# Patient Record
Sex: Female | Born: 1965
Health system: Southern US, Community
[De-identification: ages and names within clinical notes are randomized; demographics above are authoritative.]

## PROBLEM LIST (undated history)

## (undated) DIAGNOSIS — M797 Fibromyalgia: Secondary | ICD-10-CM

## (undated) DIAGNOSIS — F1910 Other psychoactive substance abuse, uncomplicated: Secondary | ICD-10-CM

## (undated) DIAGNOSIS — F32A Depression, unspecified: Secondary | ICD-10-CM

## (undated) DIAGNOSIS — F319 Bipolar disorder, unspecified: Secondary | ICD-10-CM

## (undated) DIAGNOSIS — K219 Gastro-esophageal reflux disease without esophagitis: Secondary | ICD-10-CM

## (undated) DIAGNOSIS — T7840XA Allergy, unspecified, initial encounter: Secondary | ICD-10-CM

## (undated) DIAGNOSIS — M1990 Unspecified osteoarthritis, unspecified site: Secondary | ICD-10-CM

## (undated) DIAGNOSIS — F419 Anxiety disorder, unspecified: Secondary | ICD-10-CM

## (undated) HISTORY — PX: MOUTH SURGERY: SHX715

## (undated) HISTORY — DX: Anxiety disorder, unspecified: F41.9

## (undated) HISTORY — PX: WISDOM TOOTH EXTRACTION: SHX21

## (undated) HISTORY — DX: Other psychoactive substance abuse, uncomplicated: F19.10

## (undated) HISTORY — DX: Unspecified osteoarthritis, unspecified site: M19.90

## (undated) HISTORY — DX: Allergy, unspecified, initial encounter: T78.40XA

## (undated) HISTORY — PX: NASAL SEPTUM SURGERY: SHX37

## (undated) HISTORY — PX: CHOLECYSTECTOMY: SHX55

## (undated) HISTORY — DX: Gastro-esophageal reflux disease without esophagitis: K21.9

## (undated) HISTORY — DX: Depression, unspecified: F32.A

## (undated) HISTORY — PX: BREAST SURGERY: SHX581

---

## 2001-10-07 ENCOUNTER — Emergency Department (HOSPITAL_COMMUNITY): Admission: EM | Admit: 2001-10-07 | Discharge: 2001-10-07 | Payer: Self-pay | Admitting: Emergency Medicine

## 2006-09-15 ENCOUNTER — Emergency Department (HOSPITAL_COMMUNITY): Admission: EM | Admit: 2006-09-15 | Discharge: 2006-09-15 | Payer: Self-pay | Admitting: Emergency Medicine

## 2007-01-10 ENCOUNTER — Emergency Department (HOSPITAL_COMMUNITY): Admission: EM | Admit: 2007-01-10 | Discharge: 2007-01-10 | Payer: Self-pay | Admitting: Emergency Medicine

## 2011-03-19 LAB — LIPASE, BLOOD: Lipase: 15

## 2011-03-19 LAB — COMPREHENSIVE METABOLIC PANEL
Alkaline Phosphatase: 185 — ABNORMAL HIGH
BUN: 6
CO2: 28
Chloride: 103
Creatinine, Ser: 0.52
GFR calc non Af Amer: >60
Total Bilirubin: 1.1

## 2011-03-19 LAB — POCT CARDIAC MARKERS
Myoglobin, poc: 26.2
Operator id: 294501

## 2011-03-19 LAB — D-DIMER, QUANTITATIVE

## 2012-04-29 ENCOUNTER — Other Ambulatory Visit: Payer: Self-pay | Admitting: Family Medicine

## 2012-04-29 DIAGNOSIS — Z1231 Encounter for screening mammogram for malignant neoplasm of breast: Secondary | ICD-10-CM

## 2012-06-10 ENCOUNTER — Encounter (HOSPITAL_COMMUNITY): Payer: Self-pay | Admitting: *Deleted

## 2012-06-10 ENCOUNTER — Emergency Department (HOSPITAL_COMMUNITY)
Admission: EM | Admit: 2012-06-10 | Discharge: 2012-06-10 | Disposition: A | Discharge status: Home / Self Care | Payer: Self-pay | Attending: Emergency Medicine | Admitting: Emergency Medicine

## 2012-06-10 DIAGNOSIS — K029 Dental caries, unspecified: Secondary | ICD-10-CM | POA: Insufficient documentation

## 2012-06-10 DIAGNOSIS — K089 Disorder of teeth and supporting structures, unspecified: Secondary | ICD-10-CM | POA: Insufficient documentation

## 2012-06-10 MED ORDER — NAPROXEN 500 MG PO TABS: 500.0000 mg | ORAL_TABLET | Freq: Two times a day (BID) | ORAL | Status: DC

## 2012-06-10 MED ORDER — TRAMADOL HCL 50 MG PO TABS: 50.0000 mg | ORAL_TABLET | Freq: Four times a day (QID) | ORAL | Status: DC | PRN

## 2012-06-10 NOTE — ED Provider Notes (Signed)
Medical screening examination/treatment/procedure(s) were performed by non-physician practitioner and as supervising physician I was immediately available for consultation/collaboration.  Gerhard Munch, MD 06/10/12 [phone]

## 2012-06-10 NOTE — ED Notes (Signed)
The pt has had  A toothache since her tooth broke off this am.  The pain is  extreme

## 2012-06-10 NOTE — ED Provider Notes (Signed)
History  This chart was scribed for non-physician practitioner working with Gerhard Munch, MD by Erskine Emery, ED Scribe. This patient was seen in room TR08C/TR08C and the patient's care was started at 21:51.   CSN: [id number]  Arrival date & time 06/10/12  2017   None     Chief Complaint  Patient presents with  . Dental Pain    (Consider location/radiation/quality/duration/timing/severity/associated sxs/prior treatment) The history is provided by the patient. No language interpreter was used.  Debbie Johnson is a 47 y.o. female who presents to the Emergency Department complaining of a toothache, like someone is sticking a needle in her gum, since her tooth broke off this morning. 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 She took Ibuprofen (800mg ) this morning for the pain. She also regularly takes lithium and Seroquel. Pt reports she stays at [facility] to aid her with her Bipolar and they regularly drug test her. She does not yet have insurance.   History reviewed. No pertinent past medical history.  History reviewed. No pertinent past surgical history.  No family history on file.  History  Substance Use Topics  . Smoking status: Never Smoker   . Smokeless tobacco: Not on file  . Alcohol Use: No    OB History    Grav Para Term Preterm Abortions TAB SAB Ect Mult Living                  Review of Systems  Constitutional: Negative for fever and chills.  HENT: Positive for dental problem.   Respiratory: Negative for shortness of breath.   Gastrointestinal: Negative for nausea and vomiting.  Neurological: Negative for weakness.  All other systems reviewed and are negative.    Allergies  Codeine and Septra  Home Medications   Current Outpatient Rx  Name  Route  Sig  Dispense  Refill  . IBUPROFEN 800 MG PO TABS   Oral   Take 1,600 mg by mouth every 8  (eight) hours as needed. For pain         . LITHIUM CARBONATE 300 MG PO CAPS   Oral   Take 300 mg by mouth 3 (three) times daily with meals.         Marland Kitchen QUETIAPINE FUMARATE 300 MG PO TABS   Oral   Take 150 mg by mouth at bedtime.           Triage Vitals: BP 133/68  Pulse 88  Temp 98.3 F (36.8 C) (Oral)  Resp 18  SpO2 98%  Physical Exam  Nursing note and vitals reviewed. Constitutional: She is oriented to person, place, and time. She appears well-developed and well-nourished. No distress.  HENT:  Head: Normocephalic and atraumatic.       Generalized tooth decay with gingival rescission.  Eyes: EOM are normal. Pupils are equal, round, and reactive to light.  Neck: Neck supple. No tracheal deviation present.  Cardiovascular: Normal rate.   Pulmonary/Chest: Effort normal. No respiratory distress.  Abdominal: Soft. She exhibits no distension.  Musculoskeletal: Normal range of motion. She exhibits no edema.  Neurological: She is alert and oriented to person, place, and time.  Skin: Skin is warm and dry.  Psychiatric: She has a normal mood and affect.    ED Course  Procedures (including critical care time) DIAGNOSTIC STUDIES: Oxygen Saturation is 98% on room air, normal by my interpretation.  COORDINATION OF CARE: 21:51--I evaluated the patient and we discussed a treatment plan including non-narcotic pain medication to which the pt agreed.   Labs Reviewed - No data to display No results found.   No diagnosis found.  Dental pain.  Referral made to dentist.  MDM    I personally performed the services described in this documentation, which was scribed in my presence. The recorded information has been reviewed and is accurate.       Jimmye Norman, NP 06/10/12 [phone]

## 2012-06-11 ENCOUNTER — Other Ambulatory Visit (HOSPITAL_COMMUNITY): Payer: Self-pay | Admitting: Family Medicine

## 2012-06-11 ENCOUNTER — Ambulatory Visit: Payer: Self-pay

## 2012-06-11 DIAGNOSIS — Z1231 Encounter for screening mammogram for malignant neoplasm of breast: Secondary | ICD-10-CM

## 2012-06-30 ENCOUNTER — Telehealth (HOSPITAL_COMMUNITY): Payer: Self-pay | Admitting: *Deleted

## 2012-06-30 NOTE — Telephone Encounter (Signed)
Telephoned patient at home # and left message to return call to [facility] 

## 2012-07-16 ENCOUNTER — Encounter (HOSPITAL_COMMUNITY): Payer: Self-pay | Admitting: *Deleted

## 2012-07-22 ENCOUNTER — Encounter (HOSPITAL_COMMUNITY): Payer: Self-pay

## 2012-07-22 ENCOUNTER — Other Ambulatory Visit: Payer: Self-pay | Admitting: Obstetrics and Gynecology

## 2012-07-22 ENCOUNTER — Ambulatory Visit (HOSPITAL_COMMUNITY)
Admission: RE | Admit: 2012-07-22 | Discharge: 2012-07-22 | Disposition: A | Discharge status: Home / Self Care | Payer: Self-pay | Source: Ambulatory Visit | Attending: Obstetrics and Gynecology | Admitting: Obstetrics and Gynecology

## 2012-07-22 VITALS — BP 122/72 | Temp 98.3°F | Ht 64.0 in | Wt 173.4 lb

## 2012-07-22 DIAGNOSIS — Z01419 Encounter for gynecological examination (general) (routine) without abnormal findings: Secondary | ICD-10-CM

## 2012-07-22 DIAGNOSIS — N644 Mastodynia: Secondary | ICD-10-CM

## 2012-07-22 HISTORY — DX: Fibromyalgia: M79.7

## 2012-07-22 HISTORY — DX: Bipolar disorder, unspecified: F31.9

## 2012-07-22 NOTE — Progress Notes (Signed)
Patient complained of lumps and pain in both breasts x 15 years. Patient stated her breasts are painful to the touch and she rates pain at a 10 out of 10 when touched.  Pap Smear:    Pap smear completed today. Per patient last Pap smear was April [facility] and abnormal. 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 Per patient she has had a previous abnormal Pap smear in 2009 that also showed HPV. Patient stated she did not follow up for either Pap smear. No Pap smear results in EPIC.  Physical exam: Breasts Breasts symmetrical. No skin abnormalities bilateral breasts. No nipple retraction bilateral breasts. No nipple discharge bilateral breasts. No lymphadenopathy. No lumps palpated bilateral breasts. Patient complained of bilateral center breast pain on exam. Referred patient to the [facility] for diagnostic mammogram and possible bilateral breast ultrasound. Appointment scheduled for Thursday, July 31, 2012 at 1440.          Pelvic/Bimanual   Ext Genitalia No lesions, no swelling and no discharge observed on external genitalia.         Vagina Vagina pink and normal texture. No lesions or discharge observed in vagina.          Cervix Cervix is present. Cervix pink and of normal texture. Cervix friable. No discharge observed.     Uterus Uterus is present and palpable. Uterus in normal position and normal size.        Adnexae Bilateral ovaries present and palpable. No tenderness on palpation.          Rectovaginal No rectal exam completed today since patient had no rectal complaints. No skin abnormalities observed on exam.

## 2012-07-22 NOTE — Patient Instructions (Signed)
Taught patient how to perform BSE. Let her know her next Pap smear will be due based on today's Pap smear results since patient stated she has a history of abnormal Pap smears. Referred patient to the [facility] for diagnostic mammogram and possible bilateral breast ultrasound. Appointment scheduled for Thursday, July 31, 2012 at 1440. Patient aware of appointment and will be there. Let patient know will follow up with her within the next couple weeks with results by letter or phone. Told patient to call the place she had her last mammogram and have them send the images to the Breast Center. Patient verbalized understanding.

## 2012-07-25 ENCOUNTER — Telehealth (HOSPITAL_COMMUNITY): Payer: Self-pay | Admitting: *Deleted

## 2012-07-25 NOTE — Telephone Encounter (Signed)
Telephoned patient at home # and discussed negative pap smear results. Next pap smear due in 3 years. Patient voiced understanding.  

## 2012-07-31 ENCOUNTER — Ambulatory Visit
Admission: RE | Admit: 2012-07-31 | Discharge: 2012-07-31 | Disposition: A | Discharge status: Home / Self Care | Payer: No Typology Code available for payment source | Source: Ambulatory Visit | Attending: Obstetrics and Gynecology | Admitting: Obstetrics and Gynecology

## 2012-07-31 ENCOUNTER — Other Ambulatory Visit: Payer: Self-pay | Admitting: Obstetrics and Gynecology

## 2012-07-31 DIAGNOSIS — N644 Mastodynia: Secondary | ICD-10-CM

## 2013-03-29 ENCOUNTER — Emergency Department (HOSPITAL_COMMUNITY)
Admission: EM | Admit: 2013-03-29 | Discharge: 2013-03-29 | Disposition: A | Discharge status: Home / Self Care | Payer: Self-pay | Attending: Emergency Medicine | Admitting: Emergency Medicine

## 2013-03-29 ENCOUNTER — Encounter (HOSPITAL_COMMUNITY): Payer: Self-pay | Admitting: Emergency Medicine

## 2013-03-29 DIAGNOSIS — F1310 Sedative, hypnotic or anxiolytic abuse, uncomplicated: Secondary | ICD-10-CM | POA: Insufficient documentation

## 2013-03-29 DIAGNOSIS — F1410 Cocaine abuse, uncomplicated: Secondary | ICD-10-CM | POA: Insufficient documentation

## 2013-03-29 DIAGNOSIS — F29 Unspecified psychosis not due to a substance or known physiological condition: Secondary | ICD-10-CM | POA: Insufficient documentation

## 2013-03-29 DIAGNOSIS — Z79899 Other long term (current) drug therapy: Secondary | ICD-10-CM | POA: Insufficient documentation

## 2013-03-29 DIAGNOSIS — G479 Sleep disorder, unspecified: Secondary | ICD-10-CM | POA: Insufficient documentation

## 2013-03-29 DIAGNOSIS — G8929 Other chronic pain: Secondary | ICD-10-CM | POA: Insufficient documentation

## 2013-03-29 DIAGNOSIS — F411 Generalized anxiety disorder: Secondary | ICD-10-CM | POA: Insufficient documentation

## 2013-03-29 DIAGNOSIS — F1910 Other psychoactive substance abuse, uncomplicated: Secondary | ICD-10-CM

## 2013-03-29 DIAGNOSIS — M545 Low back pain, unspecified: Secondary | ICD-10-CM | POA: Insufficient documentation

## 2013-03-29 DIAGNOSIS — F909 Attention-deficit hyperactivity disorder, unspecified type: Secondary | ICD-10-CM | POA: Insufficient documentation

## 2013-03-29 DIAGNOSIS — F319 Bipolar disorder, unspecified: Secondary | ICD-10-CM

## 2013-03-29 DIAGNOSIS — Z9119 Patient's noncompliance with other medical treatment and regimen: Secondary | ICD-10-CM | POA: Insufficient documentation

## 2013-03-29 DIAGNOSIS — Z91199 Patient's noncompliance with other medical treatment and regimen due to unspecified reason: Secondary | ICD-10-CM | POA: Insufficient documentation

## 2013-03-29 DIAGNOSIS — Z791 Long term (current) use of non-steroidal anti-inflammatories (NSAID): Secondary | ICD-10-CM | POA: Insufficient documentation

## 2013-03-29 DIAGNOSIS — F1210 Cannabis abuse, uncomplicated: Secondary | ICD-10-CM | POA: Insufficient documentation

## 2013-03-29 LAB — URINE MICROSCOPIC-ADD ON

## 2013-03-29 LAB — COMPREHENSIVE METABOLIC PANEL
ALT: 49 U/L — ABNORMAL HIGH (ref 0–35)
Calcium: 10 mg/dL (ref 8.4–10.5)
Creatinine, Ser: 0.6 mg/dL (ref 0.50–1.10)
GFR calc Af Amer: >90 mL/min (ref >90)
GFR calc non Af Amer: >90 mL/min (ref >90)
Glucose, Bld: 98 mg/dL (ref 70–99)
Sodium: 135 mEq/L (ref 135–145)
Total Protein: 8 g/dL (ref 6.0–8.3)

## 2013-03-29 LAB — RAPID URINE DRUG SCREEN, HOSP PERFORMED
Amphetamines: NOT DETECTED
Barbiturates: NOT DETECTED
Benzodiazepines: NOT DETECTED
Cocaine: POSITIVE — AB
Opiates: NOT DETECTED

## 2013-03-29 LAB — ETHANOL: Alcohol, Ethyl (B): <11 mg/dL (ref 0–11)

## 2013-03-29 LAB — URINALYSIS, ROUTINE W REFLEX MICROSCOPIC
Bilirubin Urine: NEGATIVE
Glucose, UA: NEGATIVE mg/dL
Ketones, ur: NEGATIVE mg/dL
Nitrite: NEGATIVE
Protein, ur: NEGATIVE mg/dL
pH: 6 (ref 5.0–8.0)

## 2013-03-29 LAB — CBC
Hemoglobin: 14.4 g/dL (ref 12.0–15.0)
MCH: 28.6 pg (ref 26.0–34.0)
MCHC: 33 g/dL (ref 30.0–36.0)
MCV: 86.5 fL (ref 78.0–100.0)
Platelets: 418 10*3/uL — ABNORMAL HIGH (ref 150–400)

## 2013-03-29 LAB — SALICYLATE LEVEL: Salicylate Lvl: <2 mg/dL — ABNORMAL LOW (ref 2.8–20.0)

## 2013-03-29 MED ORDER — ALUM & MAG HYDROXIDE-SIMETH 200-200-20 MG/5ML PO SUSP: 30.0000 mL | ORAL | Status: DC | PRN

## 2013-03-29 MED ORDER — IBUPROFEN 200 MG PO TABS: 600.0000 mg | ORAL_TABLET | Freq: Three times a day (TID) | ORAL | Status: DC | PRN

## 2013-03-29 MED ORDER — ONDANSETRON HCL 4 MG PO TABS: 4.0000 mg | ORAL_TABLET | Freq: Three times a day (TID) | ORAL | Status: DC | PRN

## 2013-03-29 MED ORDER — NICOTINE 21 MG/24HR TD PT24: 21.0000 mg | MEDICATED_PATCH | Freq: Every day | TRANSDERMAL | Status: DC

## 2013-03-29 NOTE — Consult Note (Signed)
[facility] Face-to-Face Psychiatry Consult   Reason for Consult:  Evaluation for inpatient treatment Referring Physician:  EDP  Debbie Johnson is an 47 y.o. female.  Assessment: AXIS I:  Substance Abuse and Bipolar Disorder AXIS II:  Deferred AXIS III:   Past Medical History  Diagnosis Date  . Fibromyalgia   . Bipolar 1 disorder    AXIS IV:  other psychosocial or environmental problems AXIS V:  51-60 moderate symptoms  Plan:  No evidence of imminent risk to self or others at present.   Patient does not meet criteria for psychiatric inpatient admission. Supportive therapy provided about ongoing stressors. Discussed crisis plan, support from social network, calling 911, coming to the Emergency Department, and calling Suicide Hotline. Follow up with primary psychiatrist   Subjective:   Debbie Johnson is a 47 y.o. female patient.  HPI:  Patient states that has been off of her bipolar medications since June 2014.  "I have been off of my meds and now I am hearing voices, which I have been hearing for a while now.  It more of whispers.  The only time it starts is when I'm not taking my medication.  I have a appointment with [facility] at 12:30 PM Monday (03/30/2013) for medication management."  Patient denies suicidal/homicidal ideation.  Patient states that voices are not command "No, they are not telling be to do anything, I can barely understand what they are saying."  Patient states that she does have paranoia "But, that is normal for me."  Patient states that she feels safe to go home.  Instructed patient since she had an appointment with her primary outpatient psychiatrist tomorrow it would be better to let her primary handle her medications, giving her medications today would not make that big of a difference prior to her appointment tomorrow.    Past Psychiatric History: Past Medical History  Diagnosis Date  . Fibromyalgia   . Bipolar 1 disorder     reports that she has never smoked.  She has never used smokeless tobacco. She reports that she does not drink alcohol or use illicit drugs. Family History  Problem Relation Age of Onset  . Cancer Mother     lung, breast  . Hypertension Mother   . Diabetes Father   . Hypertension Father   . Hypertension Brother   . Hypertension Brother   . Hypertension Brother            Allergies:   Allergies  Allergen Reactions  . Codeine Nausea And Vomiting  . Septra [Sulfamethoxazole-Trimethoprim] Nausea And Vomiting and Rash    ACT Assessment Complete:  Yes:    Educational Status    Risk to Self: Risk to self Suicidal Ideation: No-Not Currently/Within Last 6 Months Suicidal Intent: No Is patient at risk for suicide?: No Suicidal Plan?: No Access to Means: Yes Specify Access to Suicidal Means: access to drugs What has been your use of drugs/alcohol within the last 12 months?: alcohyol and crack Previous Attempts/Gestures: Yes How many times?: 1 Other Self Harm Risks: na Triggers for Past Attempts: Other (Comment) (depression in past) Intentional Self Injurious Behavior: None Family Suicide History: No Recent stressful life event(s): Other (Comment) (off meds) Persecutory voices/beliefs?: No Depression: Yes Depression Symptoms: Fatigue;Feeling angry/irritable;Feeling worthless/self pity;Guilt;Loss of interest in usual pleasures Substance abuse history and/or treatment for substance abuse?: Yes Suicide prevention information given to non-admitted patients: Yes  Risk to Others: Risk to Others Homicidal Ideation: No Thoughts of Harm to Others: No Current  Homicidal Intent: No Current Homicidal Plan: No Access to Homicidal Means: No Identified Victim: na History of harm to others?: No Assessment of Violence: None Noted Violent Behavior Description: cooperative Does patient have access to weapons?: No Criminal Charges Pending?: No Does patient have a court date: No  Abuse: Abuse/Neglect Assessment (Assessment to be  complete while patient is alone) Physical Abuse: Denies Verbal Abuse: Denies Sexual Abuse: Denies Exploitation of patient/patient's resources: Denies Self-Neglect: Denies  Prior Inpatient Therapy: Prior Inpatient Therapy Prior Inpatient Therapy: Yes Prior Therapy Dates: 2012 Prior Therapy [facility]Provider[facility]): [facility] Reason for Treatment: drugs  Prior Outpatient Therapy: Prior Outpatient Therapy Prior Outpatient Therapy: Yes Prior Therapy Dates: 2014 Prior Therapy Facilty/Provider(s): [facility] Reason for Treatment: med mgt  Additional Information: Additional Information 1:1 In Past 12 Months?: No CIRT Risk: No Elopement Risk: No Does patient have medical clearance?: Yes                  Objective: Blood pressure 127/80, pulse 83, temperature 98.6 F (37 C), resp. rate 18, SpO2 97.00%.There is no weight on file to calculate BMI. Results for orders placed during the hospital encounter of 03/29/13 (from the past 72 hour(s))  URINE RAPID DRUG SCREEN (HOSP PERFORMED)     Status: Abnormal   Collection Time    03/29/13 11:38 AM      Result Value Range   Opiates NONE DETECTED  NONE DETECTED   Cocaine POSITIVE (*) NONE DETECTED   Benzodiazepines NONE DETECTED  NONE DETECTED   Amphetamines NONE DETECTED  NONE DETECTED   Tetrahydrocannabinol POSITIVE (*) NONE DETECTED   Barbiturates NONE DETECTED  NONE DETECTED   Comment:            DRUG SCREEN FOR MEDICAL PURPOSES     ONLY.  IF CONFIRMATION IS NEEDED     FOR ANY PURPOSE, NOTIFY LAB     WITHIN 5 DAYS.                LOWEST DETECTABLE LIMITS     FOR URINE DRUG SCREEN     Drug Class       Cutoff (ng/mL)     Amphetamine      1000     Barbiturate      200     Benzodiazepine   200     Tricyclics       300     Opiates          300     Cocaine          300     THC              50  CBC     Status: Abnormal   Collection Time    03/29/13 12:00 PM      Result Value Range   WBC 9.5  4.0 - 10.5 K/uL   RBC 5.04  3.87 - 5.11  MIL/uL   Hemoglobin 14.4  12.0 - 15.0 g/dL   HCT 16.1  09.6 - 04.5 %   MCV 86.5  78.0 - 100.0 fL   MCH 28.6  26.0 - 34.0 pg   MCHC 33.0  30.0 - 36.0 g/dL   RDW 40.9  81.1 - 91.4 %   Platelets 418 (*) 150 - 400 K/uL    Current Facility-Administered Medications  Medication Dose Route Frequency Provider Last Rate Last Dose  . alum & mag hydroxide-simeth (MAALOX/MYLANTA) 200-200-20 MG/5ML suspension 30 mL  30 mL Oral PRN Renne Crigler, PA-C      .  ibuprofen (ADVIL,MOTRIN) tablet 600 mg  600 mg Oral Q8H PRN Renne Crigler, PA-C      . nicotine (NICODERM CQ - dosed in mg/24 hours) patch 21 mg  21 mg Transdermal Daily Renne Crigler, PA-C      . ondansetron (ZOFRAN) tablet 4 mg  4 mg Oral Q8H PRN Renne Crigler, PA-C       Current Outpatient Prescriptions  Medication Sig Dispense Refill  . lithium carbonate 300 MG capsule Take 300 mg by mouth 3 (three) times daily with meals.        Psychiatric Specialty Exam:     Blood pressure 127/80, pulse 83, temperature 98.6 F (37 C), resp. rate 18, SpO2 97.00%.There is no weight on file to calculate BMI.  General Appearance: Casual  Eye Contact::  Good  Speech:  Clear and Coherent and Normal Rate  Volume:  Normal  Mood:  Depressed  Affect:  Depressed  Thought Process:  Circumstantial and Goal Directed  Orientation:  Full (Time, Place, and Person)  Thought Content:  Wanting to get back on her medication  Suicidal Thoughts:  No  Homicidal Thoughts:  No  Memory:  Immediate;   Good Recent;   Good Remote;   Good  Judgement:  Fair  Insight:  Present  Psychomotor Activity:  Normal  Concentration:  Good  Recall:  Good  Akathisia:  No  Handed:  Right  AIMS (if indicated):     Assets:  Communication Skills Desire for Improvement  Sleep:      Face to face interview and consult with Dr. Gilmore Laroche  Treatment Plan Summary: Follow up with primary outpatient   Disposition:  Discharge home to follow up on current scheduled appointment for  03/30/2013 12:30 PM at [facility]  Give patient resource information for rehab services and facilities   Assunta Found, FNP-BC 03/29/2013 12:46 PM Agree with plan.

## 2013-03-29 NOTE — ED Notes (Signed)
Pt is vol here states that she was released from prison and has not been on her medication since June. States that she is also a recovering drug addict and has been relapsed for the past week. Pt wants helped from drugs and to get back on her medication. Pt states that she has a lot going on in life and that at times she feels SI to help with dealing with some of her problems, but is no currenlty SI. Calm, brought in by sherriff

## 2013-03-29 NOTE — ED Provider Notes (Signed)
Medical screening examination/treatment/procedure(s) were performed by non-physician practitioner and as supervising physician I was immediately available for consultation/collaboration.  EKG Interpretation   None         Layla Maw Gwendolyne Welford, DO 03/29/13 1528

## 2013-03-29 NOTE — BH Assessment (Signed)
Assessment Note  Debbie Johnson is an 47 y.o. female.   Pt brought in by daughter because pt has been on a 5 day binge of using alcohol, crack and THC.  Pt has been clean for 15 months.  Pt was released from prison 9/13.  Pt is employed.  Pt was on psych medications with [facility] until June of 2014 when pt decided to stop taking meds  These meds were Latuda and Lithium.    Pt reports she has an apt with [facility] Tuesday of this week at 1230.  No way to confirm on the weekend due to [facility] system.    Pt denies current suicidal, homicidal intent or plan.  Pt denies current legal issues.  Pt was in prison for a period due to drug charges but released 9-13.  Pt lives with daughter.  Pt reports feeling safe and wants to go home to eat and sleep.  Pt believes she can wait until her Tuesday apt with [facility] to get started on her medications.    Recommendations:  Pt does not want to harm self.  Pt wants to go home.  Pt has an apt with [facility] on 03-31-13 at 1230 per pt.  Psychiatry to round and determine dispo.  Axis I: Bipolar, Depressed and Substance Abuse Axis II: Deferred Axis III:  Past Medical History  Diagnosis Date  . Fibromyalgia   . Bipolar 1 disorder    Axis IV: other psychosocial or environmental problems, problems related to legal system/crime, problems related to social environment and problems with primary support group Axis V: 51-60 moderate symptoms  Past Medical History:  Past Medical History  Diagnosis Date  . Fibromyalgia   . Bipolar 1 disorder     Past Surgical History  Procedure Laterality Date  . Cholecystectomy    . Breast surgery    . Nasal septum surgery    . Wisdom tooth extraction      Family History:  Family History  Problem Relation Age of Onset  . Cancer Mother     lung, breast  . Hypertension Mother   . Diabetes Father   . Hypertension Father   . Hypertension Brother   . Hypertension Brother   . Hypertension Brother     Social History:   reports that she has never smoked. She has never used smokeless tobacco. She reports that she does not drink alcohol or use illicit drugs.  Additional Social History:  Alcohol / Drug Use Pain Medications: na Prescriptions: na Over the Counter: na History of alcohol / drug use?: Yes Longest period of sobriety (when/how long): 15 months Negative Consequences of Use: Financial;Legal;Personal relationships;Work / Mining engineer #2 Name of Substance 2: alcohol 2 - Age of First Use: teen 2 - Amount (size/oz): varies 2 - Frequency: 5 days 2 - Duration: 5 days 2 - Last Use / Amount: 03-28-13  CIWA: CIWA-Ar BP: 127/80 mmHg Pulse Rate: 83 COWS:    Allergies:  Allergies  Allergen Reactions  . Codeine Nausea And Vomiting  . Septra [Sulfamethoxazole-Trimethoprim] Nausea And Vomiting and Rash    Home Medications:  (Not in a hospital admission)  OB/GYN Status:  No LMP recorded. Patient is postmenopausal.  General Assessment Data Location of Assessment: [facility] ED Is this a Tele or Face-to-Face Assessment?: Face-to-Face Is this an Initial Assessment or a Re-assessment for this encounter?: Initial Assessment Living Arrangements: Children Can pt return to current living arrangement?: Yes Admission Status: Voluntary Is patient capable of signing voluntary admission?: Yes Transfer  from: Acute Hospital Referral Source: MD  Medical Screening Exam [facility] Walk-in ONLY) Medical Exam completed: Yes  [facility] Crisis Care Plan Living Arrangements: Children  Education Status Is patient currently in school?: No Current Grade: na Highest grade of school patient has completed: na Name of school: na Contact person: na  Risk to self Suicidal Ideation: No-Not Currently/Within Last 6 Months Suicidal Intent: No Is patient at risk for suicide?: No Suicidal Plan?: No Access to Means: Yes Specify Access to Suicidal Means: access to drugs What has been your use of drugs/alcohol within the last 12  months?: alcohyol and crack Previous Attempts/Gestures: Yes How many times?: 1 Other Self Harm Risks: na Triggers for Past Attempts: Other (Comment) (depression in past) Intentional Self Injurious Behavior: None Family Suicide History: No Recent stressful life event(s): Other (Comment) (off meds) Persecutory voices/beliefs?: No Depression: Yes Depression Symptoms: Fatigue;Feeling angry/irritable;Feeling worthless/self pity;Guilt;Loss of interest in usual pleasures Substance abuse history and/or treatment for substance abuse?: Yes Suicide prevention information given to non-admitted patients: Yes  Risk to Others Homicidal Ideation: No Thoughts of Harm to Others: No Current Homicidal Intent: No Current Homicidal Plan: No Access to Homicidal Means: No Identified Victim: na History of harm to others?: No Assessment of Violence: None Noted Violent Behavior Description: cooperative Does patient have access to weapons?: No Criminal Charges Pending?: No Does patient have a court date: No  Psychosis Hallucinations: Auditory (hx of; baseline) Delusions: Unspecified (ringing in ear sometimes)  Mental Status Report Appear/Hygiene: Disheveled Eye Contact: Good Motor Activity: Unremarkable Speech: Logical/coherent Level of Consciousness: Alert Mood: Depressed;Anxious Affect: Anxious;Depressed;Appropriate to circumstance Anxiety Level: Minimal Thought Processes: Coherent Judgement: Unimpaired Orientation: Person;Place;Situation Obsessive Compulsive Thoughts/Behaviors: None  Cognitive Functioning Concentration: Decreased Memory: Recent Intact;Remote Intact IQ: Average Insight: Poor Impulse Control: Poor Appetite: Poor Weight Loss: 0 Weight Gain: 0 Sleep: Decreased Total Hours of Sleep: 1 Vegetative Symptoms: None  ADLScreening [facility] Assessment Services) Patient's cognitive ability adequate to safely complete daily activities?: Yes Patient able to express need for  assistance with ADLs?: Yes Independently performs ADLs?: Yes (appropriate for developmental age)  Prior Inpatient Therapy Prior Inpatient Therapy: Yes Prior Therapy Dates: 2012 Prior Therapy Facilty/Provider([facility]): [facility] Reason for Treatment: drugs  Prior Outpatient Therapy Prior Outpatient Therapy: Yes Prior Therapy Dates: 2014 Prior Therapy Facilty/Provider(s): [facility] Reason for Treatment: med mgt  ADL Screening (condition at time of admission) Patient's cognitive ability adequate to safely complete daily activities?: Yes Is the patient deaf or have difficulty hearing?: No Does the patient have difficulty seeing, even when wearing glasses/contacts?: No Does the patient have difficulty concentrating, remembering, or making decisions?: No Patient able to express need for assistance with ADLs?: Yes Does the patient have difficulty dressing or bathing?: No Independently performs ADLs?: Yes (appropriate for developmental age) Does the patient have difficulty walking or climbing stairs?: No Weakness of Legs: None Weakness of Arms/Hands: None  Home Assistive Devices/Equipment Home Assistive Devices/Equipment: None  Therapy Consults (therapy consults require a physician order) PT Evaluation Needed: No OT Evalulation Needed: No SLP Evaluation Needed: No Abuse/Neglect Assessment (Assessment to be complete while patient is alone) Physical Abuse: Denies Verbal Abuse: Denies Sexual Abuse: Denies Exploitation of patient/patient's resources: Denies Self-Neglect: Denies Values / Beliefs Cultural Requests During Hospitalization: None Spiritual Requests During Hospitalization: None Consults Spiritual Care Consult Needed: No Social Work Consult Needed: No Merchant navy officer (For Healthcare) Advance Directive: Patient does not have advance directive Pre-existing out of facility DNR order (yellow form or pink MOST form): No Nutrition Screen- [facility] Patient's  home diet:  Regular  Additional Information 1:1 In Past 12 Months?: No CIRT Risk: No Elopement Risk: No Does patient have medical clearance?: Yes     Disposition:  Disposition Initial Assessment Completed for this Encounter: Yes Disposition of Patient: Outpatient treatment Type of outpatient treatment: Adult [facility])  On Site Evaluation by:   Reviewed with Physician:    Titus Mould, Eppie Gibson 03/29/2013 1:10 PM

## 2013-03-29 NOTE — ED Notes (Signed)
ACT team at bedside talking with pt. Psych ED unable to take pt at this time and will call back to take report.

## 2013-03-29 NOTE — ED Provider Notes (Signed)
CSN: [id number]     Arrival date & time 03/29/13  1120 History   First MD Initiated Contact with Patient 03/29/13 1125     Chief Complaint  Patient presents with  . Medical Clearance   (Consider location/radiation/quality/duration/timing/severity/associated sxs/prior Treatment) HPI Comments: Patient with history of bipolar disorder presents with complaint of uncontrolled bipolar symptoms, medication noncompliance, relapse of polysubstance abuse. Patient states that she was recently in prison and on her medications, however upon her release she stopped taking her medications and has been off for several months. Recently she has been manic and over this weekend has been using crack cocaine, marijuana, and has taken Xanax and amitriptyline. She presents today requesting help for these problems. She denies recent medical complaints. She has chronic lower back pain which is at baseline. Sometimes she uses the drugs to help with her pain. Onset of symptoms insidious. Course is constant.  The history is provided by the patient.    Past Medical History  Diagnosis Date  . Fibromyalgia   . Bipolar 1 disorder    Past Surgical History  Procedure Laterality Date  . Cholecystectomy    . Breast surgery    . Nasal septum surgery    . Wisdom tooth extraction     Family History  Problem Relation Age of Onset  . Cancer Mother     lung, breast  . Hypertension Mother   . Diabetes Father   . Hypertension Father   . Hypertension Brother   . Hypertension Brother   . Hypertension Brother    History  Substance Use Topics  . Smoking status: Never Smoker   . Smokeless tobacco: Never Used  . Alcohol Use: No   OB History   Grav Para Term Preterm Abortions TAB SAB Ect Mult Living   6    4  4   2      Review of Systems  Constitutional: Negative for fever.  HENT: Negative for rhinorrhea and sore throat.   Eyes: Negative for redness.  Respiratory: Negative for cough.   Cardiovascular: Negative for  chest pain.  Gastrointestinal: Negative for nausea, vomiting, abdominal pain and diarrhea.  Genitourinary: Negative for dysuria.  Musculoskeletal: Negative for myalgias.  Skin: Negative for rash.  Neurological: Negative for headaches.  Psychiatric/Behavioral: Positive for confusion and sleep disturbance. Negative for suicidal ideas and self-injury.    Allergies  Codeine and Septra  Home Medications   Current Outpatient Rx  Name  Route  Sig  Dispense  Refill  . ibuprofen (ADVIL,MOTRIN) 800 MG tablet   Oral   Take 1,600 mg by mouth every 8 (eight) hours as needed. For pain         . lithium carbonate 300 MG capsule   Oral   Take 300 mg by mouth 3 (three) times daily with meals.         . naproxen (NAPROSYN) 500 MG tablet   Oral   Take 1 tablet (500 mg total) by mouth 2 (two) times daily.   30 tablet   0   . QUEtiapine (SEROQUEL) 300 MG tablet   Oral   Take 150 mg by mouth at bedtime.         . traMADol (ULTRAM) 50 MG tablet   Oral   Take 1 tablet (50 mg total) by mouth every 6 (six) hours as needed for pain.   15 tablet   0    BP 127/80  Pulse 83  Temp(Src) 98.6 F (37 C)  Resp 18  SpO2 97% Physical Exam  Nursing note and vitals reviewed. Constitutional: She appears well-developed and well-nourished.  HENT:  Head: Normocephalic and atraumatic.  Eyes: Conjunctivae are normal. Right eye exhibits no discharge. Left eye exhibits no discharge.  Neck: Normal range of motion. Neck supple.  Cardiovascular: Normal rate, regular rhythm and normal heart sounds.   Pulmonary/Chest: Effort normal and breath sounds normal.  Abdominal: Soft. There is no tenderness.  Neurological: She is alert.  Skin: Skin is warm and dry.  Psychiatric: Her mood appears anxious. She is hyperactive.    ED Course  Procedures (including critical care time) Labs Review Labs Reviewed  CBC - Abnormal; Notable for the following:    Platelets 418 (*)    All other components within  normal limits  COMPREHENSIVE METABOLIC PANEL - Abnormal; Notable for the following:    Potassium 3.4 (*)    ALT 49 (*)    Alkaline Phosphatase 122 (*)    All other components within normal limits  SALICYLATE LEVEL - Abnormal; Notable for the following:    Salicylate Lvl <2.0 (*)    All other components within normal limits  URINE RAPID DRUG SCREEN (HOSP PERFORMED) - Abnormal; Notable for the following:    Cocaine POSITIVE (*)    Tetrahydrocannabinol POSITIVE (*)    All other components within normal limits  URINALYSIS, ROUTINE W REFLEX MICROSCOPIC - Abnormal; Notable for the following:    APPearance CLOUDY (*)    Leukocytes, UA TRACE (*)    All other components within normal limits  ACETAMINOPHEN LEVEL  ETHANOL  URINE MICROSCOPIC-ADD ON  LITHIUM LEVEL   Imaging Review No results found.  EKG Interpretation   None      11:36 AM Patient seen and examined. Work-up initiated. Medications ordered.   Vital signs reviewed and are as follows: Filed Vitals:   03/29/13 1144  BP: 127/80  Pulse: 83  Temp: 98.6 F (37 C)  Resp: 18   12:54 PM TTS aware. Reita Cliche has seen. Awaiting psych to see.     1:01 PM Labs reviewed. Pt medically cleared.   Patient was seen by psychiatry and cleared for discharge to home. She has been provided resources. She has followup at [facility] this week.  MDM   1. Bipolar disorder   2. Polysubstance abuse    Patient denies current SI, evaluated by psychiatry it was felt appropriate for outpatient treatment. Followup at [facility]. Patient restarted on her medications per psych.     Renne Crigler, PA-C 03/29/13 1453

## 2013-05-30 ENCOUNTER — Emergency Department (HOSPITAL_COMMUNITY)
Admission: EM | Admit: 2013-05-30 | Discharge: 2013-05-30 | Disposition: A | Discharge status: Home / Self Care | Payer: Self-pay | Attending: Emergency Medicine | Admitting: Emergency Medicine

## 2013-05-30 ENCOUNTER — Encounter (HOSPITAL_COMMUNITY): Payer: Self-pay | Admitting: Emergency Medicine

## 2013-05-30 DIAGNOSIS — M545 Low back pain, unspecified: Secondary | ICD-10-CM | POA: Insufficient documentation

## 2013-05-30 DIAGNOSIS — F17200 Nicotine dependence, unspecified, uncomplicated: Secondary | ICD-10-CM | POA: Insufficient documentation

## 2013-05-30 DIAGNOSIS — E669 Obesity, unspecified: Secondary | ICD-10-CM | POA: Insufficient documentation

## 2013-05-30 DIAGNOSIS — M5416 Radiculopathy, lumbar region: Secondary | ICD-10-CM

## 2013-05-30 DIAGNOSIS — F319 Bipolar disorder, unspecified: Secondary | ICD-10-CM | POA: Insufficient documentation

## 2013-05-30 DIAGNOSIS — IMO0002 Reserved for concepts with insufficient information to code with codable children: Secondary | ICD-10-CM | POA: Insufficient documentation

## 2013-05-30 DIAGNOSIS — IMO0001 Reserved for inherently not codable concepts without codable children: Secondary | ICD-10-CM | POA: Insufficient documentation

## 2013-05-30 DIAGNOSIS — Z79899 Other long term (current) drug therapy: Secondary | ICD-10-CM | POA: Insufficient documentation

## 2013-05-30 DIAGNOSIS — R209 Unspecified disturbances of skin sensation: Secondary | ICD-10-CM | POA: Insufficient documentation

## 2013-05-30 DIAGNOSIS — R159 Full incontinence of feces: Secondary | ICD-10-CM | POA: Insufficient documentation

## 2013-05-30 MED ORDER — PREDNISONE 10 MG PO TABS: 20.0000 mg | ORAL_TABLET | Freq: Every day | ORAL | Status: DC

## 2013-05-30 MED ORDER — CYCLOBENZAPRINE HCL 10 MG PO TABS: 10.0000 mg | ORAL_TABLET | Freq: Three times a day (TID) | ORAL | Status: DC | PRN

## 2013-05-30 MED ORDER — HYDROCODONE-ACETAMINOPHEN 5-325 MG PO TABS: 1.0000 | ORAL_TABLET | ORAL | Status: DC | PRN

## 2013-05-30 NOTE — ED Notes (Signed)
Pt presents with back pain that has been going on for 6 months but became worse yesterday.

## 2013-05-30 NOTE — ED Notes (Signed)
Pt c/o low right back pain x 7-8 months intermittent. Pt states pain usually resolves. Pain worse with sitting with radiation down R leg

## 2013-05-30 NOTE — ED Provider Notes (Signed)
CSN: [id number]     Arrival date & time 05/30/13  1905 History  This chart was scribed for non-physician practitioner, Ivonne Andrew, PA-C,working with Junius Argyle, MD, by Karle Plumber, ED Scribe.  This patient was seen in room WTR8/WTR8 and the patient's care was started at 10:14 PM.  Chief Complaint  Patient presents with  . Back Pain   The history is provided by the patient. No language interpreter was used.   HPI Comments:  Debbie Johnson is a 47 y.o. female who presents to the Emergency Department complaining of severe, worsening intermittent back pain that radiates down the back of her right leg that has been worsening for two days. She also reports upper back pain to the left shoulder blade. Pt states she has a hard time lifting her right leg. She states she has been seen by Dr. Izola Price for this in the past and treated with Motrin 800 mg with no relief. She reports he suggested an MRI. She states she feels like her knee is going to give out. She reports associated tingling of the right leg. She reports some episodes of slight bowel incontinence for the past two weeks. This is a small amount of soft stool occasionally in underwear. Denies perineal numbness.  No urinary incontinence or retention. She denies any recent injury. She denies hematuria, dysuria, or urinary incontinence. She reports having a h/o difficulty urinating and being followed by a nephrologist.   Past Medical History  Diagnosis Date  . Fibromyalgia   . Bipolar 1 disorder    Past Surgical History  Procedure Laterality Date  . Cholecystectomy    . Breast surgery    . Nasal septum surgery    . Wisdom tooth extraction     Family History  Problem Relation Age of Onset  . Cancer Mother     lung, breast  . Hypertension Mother   . Diabetes Father   . Hypertension Father   . Hypertension Brother   . Hypertension Brother   . Hypertension Brother    History  Substance Use Topics  . Smoking status: Current  Every Day Smoker    Types: Cigarettes  . Smokeless tobacco: Never Used  . Alcohol Use: No   OB History   Grav Para Term Preterm Abortions TAB SAB Ect Mult Living   6    4  4   2      Review of Systems  Constitutional: Negative for fever.  Genitourinary: Negative for dysuria, hematuria and difficulty urinating.  Musculoskeletal: Positive for back pain.  All other systems reviewed and are negative.    Allergies  Codeine and Septra  Home Medications   Current Outpatient Rx  Name  Route  Sig  Dispense  Refill  . lithium carbonate 300 MG capsule   Oral   Take 300 mg by mouth 3 (three) times daily with meals.          Triage Vitals: BP 113/70  Pulse 122  Temp(Src) 98.6 F (37 C) (Oral)  Resp 20  Wt 168 lb (76.204 kg)  SpO2 95% Physical Exam  Nursing note and vitals reviewed. Constitutional: She is oriented to person, place, and time. She appears well-developed and well-nourished.  Over weight  HENT:  Head: Normocephalic and atraumatic.  Eyes: EOM are normal.  Neck: Normal range of motion.  Cardiovascular: Normal rate.   Pulmonary/Chest: Effort normal.  Genitourinary:  Rectal tone normal.  Musculoskeletal: Normal range of motion. She exhibits tenderness. She exhibits no edema.  Tender to palpation along lumbar spine region   Neurological: She is alert and oriented to person, place, and time. She has normal reflexes.  Reflex Scores:      Patellar reflexes are 2+ on the right side and 2+ on the left side. Normal sensations.  Skin: Skin is warm and dry.  Psychiatric: She has a normal mood and affect. Her behavior is normal.    ED Course  Procedures  DIAGNOSTIC STUDIES: Oxygen Saturation is 95% on RA, adequate by my interpretation.   COORDINATION OF CARE: 10:22 PM- Pt seen and evaluated.  Will perform a rectal exam for rectal tone. Pt verbalizes understanding and agrees to plan.  10:30 PM- normal rectal tone. Normal strength in lower extremities. Normal  perineal sensations. At this time very low suspicion for a cauda equina syndrome. Will prescribe muscle relaxers and pain medication. Will also prescribe prednisone. Advised pt to apply heating pad. Did discuss with patient that she most likely lower car MRI for evaluation of her condition at some point. Also gave strict return precautions to return to the emergency department.     MDM   1. Low back pain   2. Lumbar radicular pain      I personally performed the services described in this documentation, which was scribed in my presence. The recorded information has been reviewed and is accurate.    Angus Seller, PA-C 05/31/13 2242

## 2013-06-01 NOTE — ED Provider Notes (Signed)
Medical screening examination/treatment/procedure(Debbie) were performed by non-physician practitioner and as supervising physician I was immediately available for consultation/collaboration.  EKG Interpretation   None         Debbie Johnson Debbie Arcenio Mullaly, MD 06/01/13 1245 

## 2014-04-05 ENCOUNTER — Encounter (HOSPITAL_COMMUNITY): Payer: Self-pay | Admitting: Emergency Medicine

## 2015-06-12 ENCOUNTER — Emergency Department (HOSPITAL_COMMUNITY)
Admission: EM | Admit: 2015-06-12 | Discharge: 2015-06-13 | Discharge status: Against Medical Advice | Payer: Self-pay | Attending: Emergency Medicine | Admitting: Emergency Medicine

## 2015-06-12 ENCOUNTER — Encounter (HOSPITAL_COMMUNITY): Payer: Self-pay

## 2015-06-12 DIAGNOSIS — F17210 Nicotine dependence, cigarettes, uncomplicated: Secondary | ICD-10-CM | POA: Insufficient documentation

## 2015-06-12 DIAGNOSIS — R339 Retention of urine, unspecified: Secondary | ICD-10-CM | POA: Insufficient documentation

## 2015-06-12 LAB — URINALYSIS, ROUTINE W REFLEX MICROSCOPIC
BILIRUBIN URINE: NEGATIVE
Glucose, UA: NEGATIVE mg/dL
Ketones, ur: NEGATIVE mg/dL
Nitrite: POSITIVE — AB
PH: 6 (ref 5.0–8.0)
Protein, ur: NEGATIVE mg/dL
SPECIFIC GRAVITY, URINE: 1.02 (ref 1.005–1.030)

## 2015-06-12 LAB — URINE MICROSCOPIC-ADD ON

## 2015-06-12 NOTE — ED Notes (Signed)
After bladder was scanned pt was able to void and collected U/A.

## 2015-06-12 NOTE — ED Notes (Signed)
Onset 2 days ago pt has only been able to urinate 1 tsp.  Pt having pressure in lower abd.

## 2015-06-12 NOTE — ED Notes (Signed)
Pt called for room placement 3x. No answer. 

## 2015-12-08 ENCOUNTER — Emergency Department (HOSPITAL_COMMUNITY)
Admission: EM | Admit: 2015-12-08 | Discharge: 2015-12-08 | Disposition: A | Discharge status: Home / Self Care | Payer: Self-pay | Attending: Emergency Medicine | Admitting: Emergency Medicine

## 2015-12-08 ENCOUNTER — Encounter (HOSPITAL_COMMUNITY): Payer: Self-pay

## 2015-12-08 DIAGNOSIS — Z79899 Other long term (current) drug therapy: Secondary | ICD-10-CM | POA: Insufficient documentation

## 2015-12-08 DIAGNOSIS — N390 Urinary tract infection, site not specified: Secondary | ICD-10-CM | POA: Insufficient documentation

## 2015-12-08 DIAGNOSIS — F17210 Nicotine dependence, cigarettes, uncomplicated: Secondary | ICD-10-CM | POA: Insufficient documentation

## 2015-12-08 LAB — URINALYSIS, ROUTINE W REFLEX MICROSCOPIC
BILIRUBIN URINE: NEGATIVE
Glucose, UA: NEGATIVE mg/dL
KETONES UR: NEGATIVE mg/dL
NITRITE: POSITIVE — AB
PH: 6 (ref 5.0–8.0)
PROTEIN: NEGATIVE mg/dL
Specific Gravity, Urine: 1.013 (ref 1.005–1.030)

## 2015-12-08 LAB — URINE MICROSCOPIC-ADD ON

## 2015-12-08 LAB — PREGNANCY, URINE: Preg Test, Ur: NEGATIVE

## 2015-12-08 MED ORDER — CEPHALEXIN 500 MG PO CAPS: 500.0000 mg | ORAL_CAPSULE | Freq: Four times a day (QID) | ORAL | Status: DC

## 2015-12-08 NOTE — ED Notes (Signed)
Pt not in room at this time

## 2015-12-08 NOTE — ED Notes (Signed)
Pt presents with 2 day h/o pressure when she voids, reports foul-smelling urine.  Pt denies any vaginal discharge.

## 2015-12-08 NOTE — Discharge Instructions (Signed)

## 2015-12-08 NOTE — ED Notes (Signed)
Karen, PA at the bedside.  

## 2015-12-08 NOTE — ED Provider Notes (Signed)
CSN: [id number]     Arrival date & time 12/08/15  [id number] History   First MD Initiated Contact with Patient 12/08/15 1011     Chief Complaint  Patient presents with  . Abdominal Pain     (Consider location/radiation/quality/duration/timing/severity/associated sxs/prior Treatment) Patient is a [age] y.o. female presenting with dysuria. The history is provided by the patient. No language interpreter was used.  Dysuria Pain quality:  Aching Pain severity:  Moderate Onset quality:  Gradual Timing:  Constant Progression:  Worsening Chronicity:  New Recent urinary tract infections: yes   Relieved by:  Nothing Worsened by:  Nothing tried Ineffective treatments:  None tried Urinary symptoms: foul-smelling urine and frequent urination   Associated symptoms: no abdominal pain   Risk factors: no recurrent urinary tract infections     Past Medical History  Diagnosis Date  . Fibromyalgia   . Bipolar 1 disorder [facility](HCC)    Past Surgical History  Procedure Laterality Date  . Cholecystectomy    . Breast surgery    . Nasal septum surgery    . Wisdom tooth extraction     Family History  Problem Relation Age of Onset  . Cancer Mother     lung, breast  . Hypertension Mother   . Diabetes Father   . Hypertension Father   . Hypertension Brother   . Hypertension Brother   . Hypertension Brother    Social History  Substance Use Topics  . Smoking status: Current Every Day Smoker -- 0.50 packs/day    Types: Cigarettes  . Smokeless tobacco: Never Used  . Alcohol Use: No   OB History    Gravida Para Term Preterm AB TAB SAB Ectopic Multiple Living   6    4  4   2      Review of Systems  Gastrointestinal: Negative for abdominal pain.  Genitourinary: Positive for dysuria.  All other systems reviewed and are negative.     Allergies  Codeine and Septra  Home Medications   Prior to Admission medications   Medication Sig Start Date End Date Taking? Authorizing Provider  cyclobenzaprine  (FLEXERIL) 10 MG tablet Take 1 tablet (10 mg total) by mouth 3 (three) times daily as needed for muscle spasms. 05/30/13   Ivonne AndrewPeter Dammen, PA-C  HYDROcodone-acetaminophen (NORCO/VICODIN) 5-325 MG per tablet Take 1-2 tablets by mouth every 4 (four) hours as needed for moderate pain. 05/30/13   Ivonne AndrewPeter Dammen, PA-C  lithium carbonate 300 MG capsule Take 300 mg by mouth 3 (three) times daily with meals.    Historical Provider, MD  lurasidone (LATUDA) 40 MG TABS tablet Take 40 mg by mouth every evening.    Historical Provider, MD  predniSONE (DELTASONE) 10 MG tablet Take 2 tablets (20 mg total) by mouth daily. 05/30/13   Peter Dammen, PA-C   BP 120/78 mmHg  Pulse 86  Temp(Src) 98.5 F (36.9 C) (Oral)  Resp 18  SpO2 98% Physical Exam  Constitutional: She is oriented to person, place, and time. She appears well-developed and well-nourished.  HENT:  Head: Normocephalic and atraumatic.  Right Ear: External ear normal.  Left Ear: External ear normal.  Eyes: Conjunctivae are normal. Pupils are equal, round, and reactive to light.  Neck: Normal range of motion. Neck supple.  Cardiovascular: Normal rate.   Pulmonary/Chest: Effort normal.  Abdominal: Soft.  Neurological: She is alert and oriented to person, place, and time. She has normal reflexes.  Skin: Skin is warm.  Psychiatric: She has a normal mood and affect.  Nursing note and vitals reviewed.   ED Course  Procedures (including critical care time) Labs Review Labs Reviewed  PREGNANCY, URINE  URINALYSIS, ROUTINE W REFLEX MICROSCOPIC (NOT AT [facility])    Imaging Review No results found. I have personally reviewed and evaluated these images and lab results as part of my medical decision-making.   EKG Interpretation None      MDM ua  Wbc's tntc many bacteria   Final diagnoses:  UTI (lower urinary tract infection)    Meds ordered this encounter  Medications  . acetaminophen (TYLENOL) 500 MG tablet    Sig: Take 1,000 mg by mouth  every 6 (six) hours as needed for mild pain.  . cephALEXin (KEFLEX) 500 MG capsule    Sig: Take 1 capsule (500 mg total) by mouth 4 (four) times daily.    Dispense:  40 capsule    Refill:  0    Order Specific Question:  Supervising Provider    Answer:  Eber HongMILLER, BRIAN [[id number]]  An After Visit Summary was printed and given to the patient.    Lonia SkinnerLeslie K CalpineSofia, PA-C 12/08/15 1213  Geoffery Lyonsouglas Delo, MD 12/08/15 1556

## 2017-03-18 ENCOUNTER — Ambulatory Visit: Payer: Self-pay | Admitting: Family Medicine

## 2017-03-22 ENCOUNTER — Encounter: Payer: Self-pay | Admitting: Family Medicine

## 2017-03-22 ENCOUNTER — Ambulatory Visit (INDEPENDENT_AMBULATORY_CARE_PROVIDER_SITE_OTHER): Payer: Self-pay | Admitting: Family Medicine

## 2017-03-22 VITALS — BP 129/70 | HR 92 | Temp 98.3°F | Resp 16 | Ht 64.0 in | Wt 171.0 lb

## 2017-03-22 DIAGNOSIS — Z23 Encounter for immunization: Secondary | ICD-10-CM

## 2017-03-22 DIAGNOSIS — Z720 Tobacco use: Secondary | ICD-10-CM

## 2017-03-22 DIAGNOSIS — G629 Polyneuropathy, unspecified: Secondary | ICD-10-CM

## 2017-03-22 DIAGNOSIS — B182 Chronic viral hepatitis C: Secondary | ICD-10-CM

## 2017-03-22 DIAGNOSIS — E663 Overweight: Secondary | ICD-10-CM

## 2017-03-22 DIAGNOSIS — F1910 Other psychoactive substance abuse, uncomplicated: Secondary | ICD-10-CM

## 2017-03-22 DIAGNOSIS — R3915 Urgency of urination: Secondary | ICD-10-CM

## 2017-03-22 MED ORDER — GABAPENTIN 300 MG PO CAPS: 300.0000 mg | ORAL_CAPSULE | Freq: Two times a day (BID) | ORAL | 0 refills | Status: DC

## 2017-03-22 NOTE — Progress Notes (Signed)
Chief Complaint  Patient presents with  . Establish Care    has hep c needs an appointment      Subjective:    Patient ID: Debbie Johnson, female    DOB: 1965/08/14, 51 y.o.   MRN: [id number]  HPI  Debbie Johnson, a 51 year old female presents to establish care. She says that she had a positive hepatitis C test and [facility] She says that she was released from prison several months ago. She is requesting a referral to infectious disease.  She has not had a primary provider due to insurance constraints. Patient has a history of polysubstance abuse prior to prison.  She is complaining of pain to lower extremities. She describes pain as burning, numbness and tingling. She says that pain occurs intermittently. She endorses urinary urgency. She denies headache, dizziness, fatigue, chest pain, dysuria, polyphagia, polydipsia, nausea, vomiting, or diarrhea.   Past Medical History:  Diagnosis Date  . Bipolar 1 disorder (HCC)   . Fibromyalgia    Social History   Social History  . Marital status: Legally Separated    Spouse name: N/A  . Number of children: N/A  . Years of education: N/A   Occupational History  . Not on file.   Social History Main Topics  . Smoking status: Current Every Day Smoker    Packs/day: 0.50    Types: Cigarettes  . Smokeless tobacco: Never Used  . Alcohol use No  . Drug use: No  . Sexual activity: Not on file   Other Topics Concern  . Not on file   Social History Narrative  . No narrative on file   Immunization History  Administered Date(s) Administered  . Influenza,inj,Quad PF,6+ Mos 03/22/2017  . Pneumococcal Polysaccharide-23 03/22/2017    Review of Systems  Constitutional: Negative for fatigue.  HENT: Negative.   Respiratory: Negative for chest tightness.   Cardiovascular: Negative.   Endocrine: Positive for polyuria. Negative for polydipsia and polyphagia.  Genitourinary: Negative.    Musculoskeletal: Positive for myalgias.  Skin: Negative.   Allergic/Immunologic: Negative.   Neurological: Positive for numbness.  Hematological: Negative.   Psychiatric/Behavioral: Negative.        History of bipolar depression       Objective:   Physical Exam  Constitutional: She is oriented to person, place, and time. She appears well-developed and well-nourished.  HENT:  Head: Normocephalic and atraumatic.  Right Ear: External ear normal.  Left Ear: External ear normal.  Nose: Nose normal.  Mouth/Throat: Oropharynx is clear and moist.  Eyes: Pupils are equal, round, and reactive to light. Conjunctivae and EOM are normal.  Neck: Normal range of motion. Neck supple.  Cardiovascular: Normal rate, regular rhythm, normal heart sounds and intact distal pulses.   Pulmonary/Chest: Effort normal and breath sounds normal.  Abdominal: Soft. Bowel sounds are normal. She exhibits no distension. There is no tenderness.  Musculoskeletal: Normal range of motion.  Neurological: She is alert and oriented to person, place, and time. She has normal reflexes.  Skin: Skin is warm and dry.  Psychiatric: She has a normal mood and affect. Her behavior is normal. Judgment and thought content normal.     BP 129/70 (BP Location: Right Arm, Patient Position: Sitting, Cuff Size: Normal)   Pulse 92   Temp 98.3 F (36.8 C) (Oral)   Resp 16   Ht 5\' 4"  (1.626 m)   Wt 171 lb (77.6 kg)   SpO2 97%   BMI  29.35 kg/m  Assessment & Plan:  1. Chronic hepatitis C without hepatic coma (HCC) Will review medical records as they become available.  Will send a referral to infectious disease at that time - COMPLETE METABOLIC PANEL WITH GFR  2. Polysubstance abuse (HCC) Patient has a history of polysubstance abuse. She has underwent a treatment program.   3. Urinary urgency Urinalysis unremarkable.   4. Tobacco use Smoking cessation instruction/counseling given:  counseled patient on the dangers of tobacco  use, advised patient to stop smoking, and reviewed strategies to maximize success  5. Overweight (BMI 25.0-29.9) Recommend a lowfat, low carbohydrate diet divided over 5-6 small meals, increase water intake to 6-8 glasses, and 150 minutes per week of cardiovascular exercise.   6. Neuropathy - Hemoglobin A1c - gabapentin (NEURONTIN) 300 MG capsule; Take 1 capsule (300 mg total) by mouth 2 (two) times daily.  Dispense: 60 capsule; Refill: 0  7. Influenza vaccination given - Flu Vaccine QUAD 6+ mos PF IM (Fluarix Quad PF)  8. Immunization due - Pneumococcal polysaccharide vaccine 23-valent greater than or equal to 2yo subcutaneous/IM   RTC: Will follow up by phone after reviewing medical records   Nolon Nations  MSN, FNP-C Patient [facility] [address] [place], [place] [address] [phone]

## 2017-03-22 NOTE — Patient Instructions (Signed)
[facility] program, call to schedule mammogram [phone]  Will send referral to infectious disease after reviewing medical records  Will follow up by phone with any abnormal lab results  Will start a trial of gabapentin for bilateral foot pain   Hepatitis C Hepatitis C is a viral infection of the liver. It can lead to scarring of the liver (cirrhosis), liver failure, or liver cancer. Hepatitis C may go undetected for months or years because people with the infection may not have symptoms, or they may have only mild symptoms. What are the causes? Hepatitis C is caused by the hepatitis C virus (HCV). The virus can be passed from one person to another through:  Blood.  Contaminated needles, such as those used for tattooing, body piercing, acupuncture, or injecting drugs.  Having unprotected sex with an infected person.  Childbirth.  Blood transfusions or organ transplants done in the [place] before 1992.  What increases the risk? Risk factors for hepatitis C include:  Having unprotected sex with an infected person.  Using illegal drugs.  What are the signs or symptoms? Symptoms of hepatitis C may include:  Fatigue.  Loss of appetite.  Nausea.  Vomiting.  Abdominal pain.  Dark yellow urine.  Yellowish skin and eyes (jaundice).  Itching of the skin.  Clay-colored bowel movements.  Joint pain.  Symptoms are not always present. How is this diagnosed? Hepatitis C is diagnosed with blood tests. Other types of tests may also be done to check how your liver is functioning. How is this treated? Your health care provider may perform noninvasive tests or a liver biopsy to help determine the best course of treatment. Treatment for hepatitis C may include one or more medicines. Your health care provider may check you for a recurring infection or other liver conditions every 6-12 months after treatment. Follow these instructions at home:  Rest as needed.  Take all  medicines as directed by your health care provider.  Do not take any medicine unless approved by your health care provider. This includes over-the-counter medicine and birth control pills.  Do not drink alcohol.  Do not have sex until approved by your health care provider.  Do not share toothbrushes, nail clippers, razors, or needles. How is this prevented? There is no vaccine for hepatitis C. The only way to prevent the disease is to reduce the risk of exposure to the virus. This may be done by:  Practicing safe sex and using condoms.  Avoiding illegal drugs.  Contact a health care provider if:  You have a fever.  You develop abdominal pain.  You develop dark urine.  You have clay-colored bowel movements.  You develop joint pains. Get help right away if:  You have increasing fatigue or weakness.  You lose your appetite.  You feel nauseous or vomit.  You develop jaundice or your jaundice gets worse.  You bruise or bleed easily. This information is not intended to replace advice given to you by your health care provider. Make sure you discuss any questions you have with your health care provider. Document Released: 05/18/2000 Document Revised: 10/27/2015 Document Reviewed: 09/02/2013 Elsevier Interactive Patient Education  2017 [employer] Inc.  Peripheral Neuropathy Peripheral neuropathy is a type of nerve damage. It affects nerves that carry signals between the spinal cord and other parts of the body. These are called peripheral nerves. With peripheral neuropathy, one nerve or a group of nerves may be damaged. What are the causes? Many things can damage peripheral nerves. For  some people with peripheral neuropathy, the cause is unknown. Some causes include:  Diabetes. This is the most common cause of peripheral neuropathy.  Injury to a nerve.  Pressure or stress on a nerve that lasts a long time.  Too little vitamin B. Alcoholism can lead to  this.  Infections.  Autoimmune diseases, such as multiple sclerosis and systemic lupus erythematosus.  Inherited nerve diseases.  Some medicines, such as cancer drugs.  Toxic substances, such as lead and mercury.  Too little blood flowing to the legs.  Kidney disease.  Thyroid disease.  What are the signs or symptoms? Different people have different symptoms. The symptoms you have will depend on which of your nerves is damaged. Common symptoms include:  Loss of feeling (numbness) in the feet and hands.  Tingling in the feet and hands.  Pain that burns.  Very sensitive skin.  Weakness.  Not being able to move a part of the body (paralysis).  Muscle twitching.  Clumsiness or poor coordination.  Loss of balance.  Not being able to control your bladder.  Feeling dizzy.  Sexual problems.  How is this diagnosed? Peripheral neuropathy is a symptom, not a disease. Finding the cause of peripheral neuropathy can be hard. To figure that out, your health care provider will take a medical history and do a physical exam. A neurological exam will also be done. This involves checking things affected by your brain, spinal cord, and nerves (nervous system). For example, your health care provider will check your reflexes, how you move, and what you can feel. Other types of tests may also be ordered, such as:  Blood tests.  A test of the fluid in your spinal cord.  Imaging tests, such as CT scans or an MRI.  Electromyography (EMG). This test checks the nerves that control muscles.  Nerve conduction velocity tests. These tests check how fast messages pass through your nerves.  Nerve biopsy. A small piece of nerve is removed. It is then checked under a microscope.  How is this treated?  Medicine is often used to treat peripheral neuropathy. Medicines may include: ? Pain-relieving medicines. Prescription or over-the-counter medicine may be suggested. ? Antiseizure medicine.  This may be used for pain. ? Antidepressants. These also may help ease pain from neuropathy. ? Lidocaine. This is a numbing medicine. You might wear a patch or be given a shot. ? Mexiletine. This medicine is typically used to help control irregular heart rhythms.  Surgery. Surgery may be needed to relieve pressure on a nerve or to destroy a nerve that is causing pain.  Physical therapy to help movement.  Assistive devices to help movement. Follow these instructions at home:  Only take over-the-counter or prescription medicines as directed by your health care provider. Follow the instructions carefully for any given medicines. Do not take any other medicines without first getting approval from your health care provider.  If you have diabetes, work closely with your health care provider to keep your blood sugar under control.  If you have numbness in your feet: ? Check every day for signs of injury or infection. Watch for redness, warmth, and swelling. ? Wear padded socks and comfortable shoes. These help protect your feet.  Do not do things that put pressure on your damaged nerve.  Do not smoke. Smoking keeps blood from getting to damaged nerves.  Avoid or limit alcohol. Too much alcohol can cause a lack of B vitamins. These vitamins are needed for healthy nerves.  Develop  a good support system. Coping with peripheral neuropathy can be stressful. Talk to a mental health specialist or join a support group if you are struggling.  Follow up with your health care provider as directed. Contact a health care provider if:  You have new signs or symptoms of peripheral neuropathy.  You are struggling emotionally from dealing with peripheral neuropathy.  You have a fever. Get help right away if:  You have an injury or infection that is not healing.  You feel very dizzy or begin vomiting.  You have chest pain.  You have trouble breathing. This information is not intended to replace  advice given to you by your health care provider. Make sure you discuss any questions you have with your health care provider. Document Released: 05/11/2002 Document Revised: 10/27/2015 Document Reviewed: 01/26/2013 Elsevier Interactive Patient Education  2017 [employer].

## 2017-03-23 LAB — COMPLETE METABOLIC PANEL WITH GFR
AG Ratio: 1.3 (calc) (ref 1.0–2.5)
ALT: 105 U/L — AB (ref 6–29)
AST: 75 U/L — AB (ref 10–35)
Albumin: 3.9 g/dL (ref 3.6–5.1)
Alkaline phosphatase (APISO): 122 U/L (ref 33–130)
BUN: 10 mg/dL (ref 7–25)
CO2: 26 mmol/L (ref 20–32)
Calcium: 9.5 mg/dL (ref 8.6–10.4)
Chloride: 105 mmol/L (ref 98–110)
Creat: 0.64 mg/dL (ref 0.50–1.05)
GFR, EST AFRICAN AMERICAN: 119 mL/min/{1.73_m2} (ref >=60)
GFR, EST NON AFRICAN AMERICAN: 103 mL/min/{1.73_m2} (ref >=60)
Globulin: 3.1 g/dL (calc) (ref 1.9–3.7)
Glucose, Bld: 179 mg/dL — ABNORMAL HIGH (ref 65–99)
Potassium: 4 mmol/L (ref 3.5–5.3)
Sodium: 139 mmol/L (ref 135–146)
Total Bilirubin: 0.4 mg/dL (ref 0.2–1.2)
Total Protein: 7 g/dL (ref 6.1–8.1)

## 2017-03-23 LAB — HEMOGLOBIN A1C
Hgb A1c MFr Bld: 5.6 % of total Hgb (ref <5.7)
MEAN PLASMA GLUCOSE: 114 (calc)
eAG (mmol/L): 6.3 (calc)

## 2017-04-15 ENCOUNTER — Other Ambulatory Visit: Payer: Self-pay | Admitting: Obstetrics and Gynecology

## 2017-04-15 DIAGNOSIS — Z1231 Encounter for screening mammogram for malignant neoplasm of breast: Secondary | ICD-10-CM

## 2017-04-22 ENCOUNTER — Encounter: Payer: Self-pay | Admitting: Family Medicine

## 2017-04-22 ENCOUNTER — Ambulatory Visit (INDEPENDENT_AMBULATORY_CARE_PROVIDER_SITE_OTHER): Payer: Self-pay | Admitting: Family Medicine

## 2017-04-22 VITALS — BP 131/65 | HR 91 | Temp 98.2°F | Resp 16 | Ht 64.0 in | Wt 174.0 lb

## 2017-04-22 DIAGNOSIS — B1920 Unspecified viral hepatitis C without hepatic coma: Secondary | ICD-10-CM

## 2017-04-22 DIAGNOSIS — G629 Polyneuropathy, unspecified: Secondary | ICD-10-CM

## 2017-04-22 MED ORDER — GABAPENTIN 300 MG PO CAPS: 300.0000 mg | ORAL_CAPSULE | Freq: Three times a day (TID) | ORAL | 5 refills | Status: AC

## 2017-04-22 NOTE — Patient Instructions (Signed)
Will increase gabapentin to 300 mg 3 times daily for better control of neuropathy. Peripheral Neuropathy Peripheral neuropathy is a type of nerve damage. It affects nerves that carry signals between the spinal cord and other parts of the body. These are called peripheral nerves. With peripheral neuropathy, one nerve or a group of nerves may be damaged. What are the causes? Many things can damage peripheral nerves. For some people with peripheral neuropathy, the cause is unknown. Some causes include:  Diabetes. This is the most common cause of peripheral neuropathy.  Injury to a nerve.  Pressure or stress on a nerve that lasts a long time.  Too little vitamin B. Alcoholism can lead to this.  Infections.  Autoimmune diseases, such as multiple sclerosis and systemic lupus erythematosus.  Inherited nerve diseases.  Some medicines, such as cancer drugs.  Toxic substances, such as lead and mercury.  Too little blood flowing to the legs.  Kidney disease.  Thyroid disease.  What are the signs or symptoms? Different people have different symptoms. The symptoms you have will depend on which of your nerves is damaged. Common symptoms include:  Loss of feeling (numbness) in the feet and hands.  Tingling in the feet and hands.  Pain that burns.  Very sensitive skin.  Weakness.  Not being able to move a part of the body (paralysis).  Muscle twitching.  Clumsiness or poor coordination.  Loss of balance.  Not being able to control your bladder.  Feeling dizzy.  Sexual problems.  How is this diagnosed? Peripheral neuropathy is a symptom, not a disease. Finding the cause of peripheral neuropathy can be hard. To figure that out, your health care provider will take a medical history and do a physical exam. A neurological exam will also be done. This involves checking things affected by your brain, spinal cord, and nerves (nervous system). For example, your health care provider  will check your reflexes, how you move, and what you can feel. Other types of tests may also be ordered, such as:  Blood tests.  A test of the fluid in your spinal cord.  Imaging tests, such as CT scans or an MRI.  Electromyography (EMG). This test checks the nerves that control muscles.  Nerve conduction velocity tests. These tests check how fast messages pass through your nerves.  Nerve biopsy. A small piece of nerve is removed. It is then checked under a microscope.  How is this treated?  Medicine is often used to treat peripheral neuropathy. Medicines may include: ? Pain-relieving medicines. Prescription or over-the-counter medicine may be suggested. ? Antiseizure medicine. This may be used for pain. ? Antidepressants. These also may help ease pain from neuropathy. ? Lidocaine. This is a numbing medicine. You might wear a patch or be given a shot. ? Mexiletine. This medicine is typically used to help control irregular heart rhythms.  Surgery. Surgery may be needed to relieve pressure on a nerve or to destroy a nerve that is causing pain.  Physical therapy to help movement.  Assistive devices to help movement. Follow these instructions at home:  Only take over-the-counter or prescription medicines as directed by your health care provider. Follow the instructions carefully for any given medicines. Do not take any other medicines without first getting approval from your health care provider.  If you have diabetes, work closely with your health care provider to keep your blood sugar under control.  If you have numbness in your feet: ? Check every day for signs of injury  or infection. Watch for redness, warmth, and swelling. ? Wear padded socks and comfortable shoes. These help protect your feet.  Do not do things that put pressure on your damaged nerve.  Do not smoke. Smoking keeps blood from getting to damaged nerves.  Avoid or limit alcohol. Too much alcohol can cause a  lack of B vitamins. These vitamins are needed for healthy nerves.  Develop a good support system. Coping with peripheral neuropathy can be stressful. Talk to a mental health specialist or join a support group if you are struggling.  Follow up with your health care provider as directed. Contact a health care provider if:  You have new signs or symptoms of peripheral neuropathy.  You are struggling emotionally from dealing with peripheral neuropathy.  You have a fever. Get help right away if:  You have an injury or infection that is not healing.  You feel very dizzy or begin vomiting.  You have chest pain.  You have trouble breathing. This information is not intended to replace advice given to you by your health care provider. Make sure you discuss any questions you have with your health care provider. Document Released: 05/11/2002 Document Revised: 10/27/2015 Document Reviewed: 01/26/2013 Elsevier Interactive Patient Education  2017 [employer]

## 2017-04-22 NOTE — Progress Notes (Signed)
Subjective:    Debbie Johnson is a 51 y.o. female with a history of polysubstance abuse presents for follow-up of bilateral lower extremity neuropathy.  Patient was started on gabapentin 300 mg twice daily 1 month ago.  She states that medication has been effective but typically wears off by the end of the day.  Patient describes neuropathy as "burning pain".  Onset of symptoms was greater than a year ago.  Symptoms are currently of moderate severity and typically occurs at varying times throughout the day. She denies fatigue, heart palpitations, dizziness, syncope, shortness of breath or heart palpitations.  She she also reports a history of hepatitis C.  Patient attempted to donate plasma and states that the letter confirming a positive hepatitis C.  Patient does not have copy of the letter   Past Medical History:  Diagnosis Date  . Bipolar 1 disorder (HCC)   . Fibromyalgia    Social History   Socioeconomic History  . Marital status: Legally Separated    Spouse name: Not on file  . Number of children: Not on file  . Years of education: Not on file  . Highest education level: Not on file  Social Needs  . Financial resource strain: Not on file  . Food insecurity - worry: Not on file  . Food insecurity - inability: Not on file  . Transportation needs - medical: Not on file  . Transportation needs - non-medical: Not on file  Occupational History  . Not on file  Tobacco Use  . Smoking status: Current Every Day Smoker    Packs/day: 0.50    Types: Cigarettes  . Smokeless tobacco: Never Used  Substance and Sexual Activity  . Alcohol use: No  . Drug use: No  . Sexual activity: Not on file  Other Topics Concern  . Not on file  Social History Narrative  . Not on file   Immunization History  Administered Date(s) Administered  . Influenza,inj,Quad PF,6+ Mos 03/22/2017  . Pneumococcal Polysaccharide-23 03/22/2017  Review of Systems  Constitutional: Negative.   HENT: Negative.    Eyes: Negative.   Respiratory: Negative.   Cardiovascular: Negative.   Gastrointestinal: Negative.   Genitourinary: Negative.   Musculoskeletal: Negative.   Skin: Negative.   Neurological: Positive for tingling. Negative for dizziness and headaches.  Psychiatric/Behavioral: Negative.     Objective:  BP 131/65 (BP Location: Right Arm, Patient Position: Sitting, Cuff Size: Normal)   Pulse 91   Temp 98.2 F (36.8 C) (Oral)   Resp 16   Ht 5\' 4"  (1.626 m)   Wt 174 lb (78.9 kg)   SpO2 96%   BMI 29.87 kg/m   General Appearance:    Alert, cooperative, no distress, appears stated age  Head:    Normocephalic, without obvious abnormality, atraumatic  Eyes:    PERRL, conjunctiva/corneas clear, EOM's intact, fundi    benign, both eyes  Ears:    Normal TM's and external ear canals, both ears  Nose:   Nares normal, septum midline, mucosa normal, no drainage    or sinus tenderness  Throat:   Lips, mucosa, and tongue normal; teeth and gums normal  Neck:   Supple, symmetrical, trachea midline, no adenopathy;    thyroid:  no enlargement/tenderness/nodules; no carotid   bruit or JVD  Back:     Symmetric, no curvature, ROM normal, no CVA tenderness  Lungs:     Clear to auscultation bilaterally, respirations unlabored  Chest Wall:    No tenderness or deformity  Heart:    Regular rate and rhythm, S1 and S2 normal, no murmur, rub   or gallop  Abdomen:     Soft, non-tender, bowel sounds active all four quadrants,    no masses, no organomegaly  Extremities:   Extremities normal, atraumatic, no cyanosis or edema  Pulses:   2+ and symmetric all extremities  Skin:   Skin color, texture, turgor normal, no rashes or lesions  Lymph nodes:   Cervical, supraclavicular, and axillary nodes normal  Neurologic:   CNII-XII intact, normal strength, sensation and reflexes    throughout   Assessment:  .BP 131/65 (BP Location: Right Arm, Patient Position: Sitting, Cuff Size: Normal)   Pulse 91   Temp 98.2  F (36.8 C) (Oral)   Resp 16   Ht 5\' 4"  (1.626 m)   Wt 174 lb (78.9 kg)   SpO2 96%   BMI 29.87 kg/m  Plan:  1. Neuropathy Will increase gabapentin to 300 mg 3 times daily. - gabapentin (NEURONTIN) 300 MG capsule; Take 1 capsule (300 mg total) 3 (three) times daily by mouth.  Dispense: 90 capsule; Refill: 5  2. Hepatitis C virus infection without hepatic coma, unspecified chronicity Will confirm hepatitis C positive and send a referral to infectious disease for further workup and evaluation. - Hepatitis C RNA quantitative   RTC: 6 months for neuropathy  Nolon NationsLaChina Moore Madalynne Gutmann  MSN, FNP-C Patient Care [facility] [address]  [place], [place] [id number] [phone]

## 2017-04-26 LAB — TIQ-NTM

## 2017-04-26 LAB — HEPATITIS C RNA QUANTITATIVE
HCV Quantitative Log: 6.54 Log IU/mL — ABNORMAL HIGH
HCV RNA, PCR, QN: 3480000 IU/mL — ABNORMAL HIGH

## 2017-04-28 ENCOUNTER — Other Ambulatory Visit: Payer: Self-pay | Admitting: Family Medicine

## 2017-04-28 DIAGNOSIS — B182 Chronic viral hepatitis C: Secondary | ICD-10-CM

## 2017-04-28 DIAGNOSIS — B1920 Unspecified viral hepatitis C without hepatic coma: Secondary | ICD-10-CM | POA: Insufficient documentation

## 2017-05-02 ENCOUNTER — Ambulatory Visit (HOSPITAL_COMMUNITY): Payer: Self-pay

## 2017-05-06 ENCOUNTER — Ambulatory Visit: Payer: Self-pay

## 2017-05-14 ENCOUNTER — Encounter: Payer: Self-pay | Admitting: Internal Medicine

## 2017-05-15 ENCOUNTER — Ambulatory Visit (INDEPENDENT_AMBULATORY_CARE_PROVIDER_SITE_OTHER): Payer: Self-pay | Admitting: Internal Medicine

## 2017-05-15 ENCOUNTER — Encounter: Payer: Self-pay | Admitting: Internal Medicine

## 2017-05-15 VITALS — BP 118/71 | HR 83 | Temp 98.0°F | Ht 64.0 in | Wt 177.0 lb

## 2017-05-15 DIAGNOSIS — B182 Chronic viral hepatitis C: Secondary | ICD-10-CM

## 2017-05-15 NOTE — Progress Notes (Signed)
[facility]   CC: consideration for treatment for chronic hepatitis C  HPI:  +Debbie Johnson is a 51 y.o. female who presents for initial evaluation and management of chronic hepatitis C.  Patient tested positive earlier this year during routine screening. Hepatitis C-associated risk factors present are: sexual contact with person with liver disease (details: shared crack pipes, snorting). Patient denies IV drug abuse. Patient has had other studies performed. Results: hepatitis C RNA by PCR, result: positive. Patient has not had prior treatment for Hepatitis C. Patient does not have a past history of liver disease. Patient does not have a family history of liver disease. Patient does not  have associated signs or symptoms related to liver disease.  Labs reviewed and confirm chronic hepatitis C with a positive viral load.   She reports being drug free now 17 months.  Interested in quitting smoking      Patient does not have documented immunity to Hepatitis A. Patient does not have documented immunity to Hepatitis B.    Review of Systems:  Constitutional: negative for fatigue and malaise Gastrointestinal: negative for diarrhea Integument/breast: negative for rash All other systems reviewed and are negative       Past Medical History:  Diagnosis Date  . Bipolar 1 disorder (HCC)   . Fibromyalgia     Prior to Admission medications   Medication Sig Start Date End Date Taking? Authorizing Provider  acetaminophen (TYLENOL) 500 MG tablet Take 1,000 mg by mouth every 6 (six) hours as needed for mild pain.   Yes [provider]  ARIPiprazole (ABILIFY) 10 MG tablet Take 10 mg by mouth daily.   Yes [provider]  gabapentin (NEURONTIN) 300 MG capsule Take 1 capsule (300 mg total) 3 (three) times daily by mouth. 04/22/17  Yes Massie MaroonHollis, Lachina M, FNP  traZODone (DESYREL) 50 MG tablet Take 50 mg by mouth at bedtime.   Yes [provider]      Allergies  Allergen Reactions  . Codeine Nausea And Vomiting  . Septra [Sulfamethoxazole-Trimethoprim] Nausea And Vomiting and Rash    Social History   Tobacco Use  . Smoking status: Current Every Day Smoker    Packs/day: 0.50    Types: Cigarettes  . Smokeless tobacco: Never Used  Substance Use Topics  . Alcohol use: No  . Drug use: No    Family History  Problem Relation Age of Onset  . Cancer Mother        lung, breast  . Hypertension Mother   . Diabetes Father   . Hypertension Father   . Hypertension Brother   . Hypertension Brother   . Hypertension Brother      Objective:  Constitutional: in no apparent distress and alert,  Vitals:   05/15/17 1032  BP: 118/71  Pulse: 83  Temp: 98 F (36.7 C)   Eyes: anicteric Cardiovascular: Cor RRR Respiratory: CTA B; normal respiratory effort Gastrointestinal: Bowel sounds are normal, liver is not enlarged, spleen is not enlarged Musculoskeletal: no pedal edema noted Skin: negatives: no rash; no porphyria cutanea tarda Lymphatic: no cervical lymphadenopathy   Laboratory Genotype: No results found for: HCVGENOTYPE HCV viral load: No results found for: HCVQUANT Lab Results  Component Value Date   WBC 9.5 03/29/2013   HGB 14.4 03/29/2013   HCT 43.6 03/29/2013   MCV 86.5 03/29/2013   PLT 418 (H) 03/29/2013    Lab Results  Component Value Date   CREATININE 0.64 03/22/2017  BUN 10 03/22/2017   NA 139 03/22/2017   K 4.0 03/22/2017   CL 105 03/22/2017   CO2 26 03/22/2017    Lab Results  Component Value Date   ALT 105 (H) 03/22/2017   AST 75 (H) 03/22/2017   ALKPHOS 122 (H) 03/29/2013     Labs and history reviewed and show CHILD-PUGH unknown 5-6 points: Child class A 7-9 points: Child class B 10-15 points: Child class C  Lab Results  Component Value Date   BILITOT 0.4 03/22/2017   ALBUMIN 4.0 03/29/2013     Assessment: New Patient with Chronic Hepatitis C genotype unknown, untreated.  I  discussed with the patient the lab findings that confirm chronic hepatitis C as well as the natural history and progression of disease including about 30% of people who develop cirrhosis of the liver if left untreated and once cirrhosis is established there is a 2-7% risk per year of liver cancer and liver failure.  I discussed the importance of treatment and benefits in reducing the risk, even if significant liver fibrosis exists.   Plan: 1) Patient counseled extensively on limiting acetaminophen to no more than 2 grams daily, avoidance of alcohol. 2) Transmission discussed with patient including sexual transmission, sharing razors and toothbrush.   3) Will need referral to gastroenterology if concern for cirrhosis 4) Will need referral for substance abuse counseling: No.; Further work up to include urine drug screen  No. 5) Will prescribe appropriate medication based on genotype and coverage  6) Hepatitis A and B titers 7) Pneumovax vaccine if not previously given 9) Further work up to include liver staging with elastography 10) will follow up after labs and elastography

## 2017-05-16 LAB — COMPLETE METABOLIC PANEL WITH GFR
AG Ratio: 1.2 (calc) (ref 1.0–2.5)
ALKALINE PHOSPHATASE (APISO): 121 U/L (ref 33–130)
ALT: 96 U/L — AB (ref 6–29)
AST: 73 U/L — AB (ref 10–35)
Albumin: 3.8 g/dL (ref 3.6–5.1)
BUN: 10 mg/dL (ref 7–25)
CALCIUM: 9.2 mg/dL (ref 8.6–10.4)
CO2: 26 mmol/L (ref 20–32)
CREATININE: 0.52 mg/dL (ref 0.50–1.05)
Chloride: 105 mmol/L (ref 98–110)
GFR, EST NON AFRICAN AMERICAN: 110 mL/min/{1.73_m2} (ref >=60)
GFR, Est African American: 127 mL/min/{1.73_m2} (ref >=60)
GLUCOSE: 98 mg/dL (ref 65–99)
Globulin: 3.3 g/dL (calc) (ref 1.9–3.7)
POTASSIUM: 3.9 mmol/L (ref 3.5–5.3)
Sodium: 138 mmol/L (ref 135–146)
Total Bilirubin: 0.5 mg/dL (ref 0.2–1.2)
Total Protein: 7.1 g/dL (ref 6.1–8.1)

## 2017-05-16 LAB — CBC WITH DIFFERENTIAL/PLATELET
BASOS ABS: 48 {cells}/uL (ref 0–200)
BASOS PCT: 0.7 %
EOS ABS: 122 {cells}/uL (ref 15–500)
Eosinophils Relative: 1.8 %
HCT: 42.3 % (ref 35.0–45.0)
Hemoglobin: 14.4 g/dL (ref 11.7–15.5)
Lymphs Abs: 2332 cells/uL (ref 850–3900)
MCH: 29.5 pg (ref 27.0–33.0)
MCHC: 34 g/dL (ref 32.0–36.0)
MCV: 86.7 fL (ref 80.0–100.0)
MONOS PCT: 8.9 %
MPV: 10.5 fL (ref 7.5–12.5)
NEUTROS PCT: 54.3 %
Neutro Abs: 3692 cells/uL (ref 1500–7800)
PLATELETS: 222 10*3/uL (ref 140–400)
RBC: 4.88 10*6/uL (ref 3.80–5.10)
RDW: 11.8 % (ref 11.0–15.0)
TOTAL LYMPHOCYTE: 34.3 %
WBC mixed population: 605 cells/uL (ref 200–950)
WBC: 6.8 10*3/uL (ref 3.8–10.8)

## 2017-05-16 LAB — TIQ-NTM

## 2017-05-16 LAB — HIV ANTIBODY (ROUTINE TESTING W REFLEX): HIV 1&2 Ab, 4th Generation: NONREACTIVE

## 2017-05-16 LAB — HEPATITIS B SURFACE ANTIBODY,QUALITATIVE: HEP B S AB: NONREACTIVE

## 2017-05-16 LAB — HEPATITIS A ANTIBODY, TOTAL: HEPATITIS A AB,TOTAL: REACTIVE — AB

## 2017-05-16 LAB — PROTIME-INR
INR: 1
Prothrombin Time: 10.4 s (ref 9.0–11.5)

## 2017-05-16 LAB — HEPATITIS B CORE ANTIBODY, TOTAL: Hep B Core Total Ab: NONREACTIVE

## 2017-05-16 LAB — HEPATITIS B SURFACE ANTIGEN: HEP B S AG: NONREACTIVE

## 2017-05-17 ENCOUNTER — Ambulatory Visit (HOSPITAL_COMMUNITY): Payer: Self-pay

## 2017-05-18 LAB — LIVER FIBROSIS, FIBROTEST-ACTITEST
ALPHA-2-MACROGLOBULIN: 302 mg/dL — AB (ref 106–279)
ALT: 99 U/L — AB (ref 6–29)
Apolipoprotein A1: 162 mg/dL (ref 101–198)
Bilirubin: 0.4 mg/dL (ref 0.2–1.2)
Fibrosis Score: 0.5
GGT: 117 U/L — ABNORMAL HIGH (ref 3–70)
Haptoglobin: 62 mg/dL (ref 43–212)
Necroinflammat ACT Score: 0.64
REFERENCE ID: 2248714

## 2017-05-20 LAB — HEPATITIS C GENOTYPE

## 2017-05-23 ENCOUNTER — Ambulatory Visit (HOSPITAL_COMMUNITY): Payer: Self-pay

## 2017-05-23 ENCOUNTER — Inpatient Hospital Stay: Admission: RE | Admit: 2017-05-23 | Payer: Self-pay | Source: Ambulatory Visit

## 2017-06-10 ENCOUNTER — Ambulatory Visit: Payer: Self-pay | Admitting: Internal Medicine

## 2017-06-13 ENCOUNTER — Ambulatory Visit (HOSPITAL_COMMUNITY): Payer: Self-pay

## 2017-07-18 ENCOUNTER — Ambulatory Visit: Payer: Self-pay | Admitting: Internal Medicine

## 2017-07-22 ENCOUNTER — Ambulatory Visit (INDEPENDENT_AMBULATORY_CARE_PROVIDER_SITE_OTHER): Payer: Self-pay | Admitting: Family Medicine

## 2017-07-22 ENCOUNTER — Encounter: Payer: Self-pay | Admitting: Family Medicine

## 2017-07-22 VITALS — BP 109/51 | HR 89 | Temp 97.9°F | Resp 16 | Ht 64.0 in | Wt 164.0 lb

## 2017-07-22 DIAGNOSIS — R220 Localized swelling, mass and lump, head: Secondary | ICD-10-CM

## 2017-07-22 DIAGNOSIS — K0889 Other specified disorders of teeth and supporting structures: Secondary | ICD-10-CM

## 2017-07-22 MED ORDER — IBUPROFEN 800 MG PO TABS
800.0000 mg | ORAL_TABLET | Freq: Three times a day (TID) | ORAL | 0 refills | Status: DC | PRN
Start: 2017-07-22 — End: 2021-09-22

## 2017-07-22 MED ORDER — CLINDAMYCIN HCL 150 MG PO CAPS: 150.0000 mg | ORAL_CAPSULE | Freq: Three times a day (TID) | ORAL | 0 refills | Status: AC

## 2017-07-22 MED ORDER — KETOROLAC TROMETHAMINE 60 MG/2ML IM SOLN
60.0000 mg | Freq: Once | INTRAMUSCULAR | Status: AC
  Administered 2017-07-22: 60 mg via INTRAMUSCULAR

## 2017-07-22 MED FILL — IBUPROFEN 800 MG TABLET: 800 | 10 days supply | Qty: 30 | Fill #0

## 2017-07-22 MED FILL — ?BENZTROPINE MES 0.5MG TAB: 0.5 | 30 days supply | Qty: 60 | Fill #0

## 2017-07-22 MED FILL — GABAPENTIN 300 MG CAPSULE: 300 | 30 days supply | Qty: 90 | Fill #0

## 2017-07-22 MED FILL — DIVALPROEX SOD DR 500 MG TA: 500 | 30 days supply | Qty: 60 | Fill #0

## 2017-07-22 MED FILL — traZODone HCL 50 MG TABS: 50 | 30 days supply | Qty: 30 | Fill #0

## 2017-07-22 MED FILL — CLINDAMYCIN HCL 150 MG CAPS: 150 | 7 days supply | Qty: 21 | Fill #0

## 2017-07-22 NOTE — Progress Notes (Signed)
Chief Complaint  Patient presents with  . Dental Pain    right bottom and top    Debbie Johnson, a 52 year old female with a history of hepatitis C and tobacco dependence presents complaining of dental pain for greater than 1 week. Patient has not been evaluated by a dentist in many years. Current pain intensity is 10/10 characterized as constant and throbbing. She last had Ibuprofen around 2 am without sustained relief. She says that dental pain is worsened by lying flat and eating. She endorses right ear pain and right facial swelling. She denies headache or fever.   Past Medical History:  Diagnosis Date  . Bipolar 1 disorder (HCC)   . Fibromyalgia    Social History   Socioeconomic History  . Marital status: Divorced    Spouse name: Not on file  . Number of children: Not on file  . Years of education: Not on file  . Highest education level: Not on file  Social Needs  . Financial resource strain: Not on file  . Food insecurity - worry: Not on file  . Food insecurity - inability: Not on file  . Transportation needs - medical: Not on file  . Transportation needs - non-medical: Not on file  Occupational History  . Not on file  Tobacco Use  . Smoking status: Current Every Day Smoker    Packs/day: 0.50    Types: Cigarettes  . Smokeless tobacco: Never Used  Substance and Sexual Activity  . Alcohol use: No  . Drug use: No  . Sexual activity: Not on file  Other Topics Concern  . Not on file  Social History Narrative  . Not on file   Immunization History  Administered Date(s) Administered  . Influenza,inj,Quad PF,6+ Mos 03/22/2017  . Pneumococcal Polysaccharide-23 03/22/2017   Review of Systems  HENT:       Dental pain and right facial swelling  Respiratory: Negative.   Cardiovascular: Negative.   Gastrointestinal: Negative.   Genitourinary: Negative.   Musculoskeletal: Negative.   Neurological: Negative.   Psychiatric/Behavioral: Negative.    Physical Exam  HENT:   Mouth/Throat: Abnormal dentition. Dental abscesses and dental caries present.  Dental caries, broken teeth. Gum erythema bilaterally. Right facial swelling  Cardiovascular: Normal rate, regular rhythm, normal heart sounds and intact distal pulses.  Pulmonary/Chest: Effort normal and breath sounds normal.  Abdominal: Soft. Bowel sounds are normal.  Neurological: She is alert.  Skin: Skin is warm and dry.   Plan  1. Right facial swelling - clindamycin (CLEOCIN) 150 MG capsule; Take 1 capsule (150 mg total) by mouth 3 (three) times daily for 7 days.  Dispense: 21 capsule; Refill: 0 - ketorolac (TORADOL) injection 60 mg - ibuprofen (ADVIL,MOTRIN) 800 MG tablet; Take 1 tablet (800 mg total) by mouth every 8 (eight) hours as needed.  Dispense: 30 tablet; Refill: 0 - Ambulatory referral to Dentistry  2. Pain, dental - clindamycin (CLEOCIN) 150 MG capsule; Take 1 capsule (150 mg total) by mouth 3 (three) times daily for 7 days.  Dispense: 21 capsule; Refill: 0 - ibuprofen (ADVIL,MOTRIN) 800 MG tablet; Take 1 tablet (800 mg total) by mouth every 8 (eight) hours as needed.  Dispense: 30 tablet; Refill: 0 - Ambulatory referral to Dentistry   RTC: As previously scheduled   The patient was given clear instructions to go to ER or return to medical center if symptoms do not improve, worsen or new problems develop. The patient verbalized understanding.    Nolon NationsLachina Moore Roquel Burgin  MSN, FNP-C [facility] [address] [place], [place] [address] [phone]

## 2017-07-22 NOTE — Patient Instructions (Signed)
Sent dental referral.  Will start Clindamycin 150 mg three times per day for dental pain and possible dental abscess For dental pain, given Toradol 60 mg without complication Ibuprofen 800 mg every 8 hours with food.  Also, tylenol 500 mg every 6 hours as needed Dental Abscess A dental abscess is pus in or around a tooth. Follow these instructions at home:  Take medicines only as told by your dentist.  If you were prescribed antibiotic medicine, finish all of it even if you start to feel better.  Rinse your mouth (gargle) often with salt water.  Do not drive or use heavy machinery, like a lawn mower, while taking pain medicine.  Do not apply heat to the outside of your mouth.  Keep all follow-up visits as told by your dentist. This is important. Contact a doctor if:  Your pain is worse, and medicine does not help. Get help right away if:  You have a fever or chills.  Your symptoms suddenly get worse.  You have a very bad headache.  You have problems breathing or swallowing.  You have trouble opening your mouth.  You have puffiness (swelling) in your neck or around your eye. This information is not intended to replace advice given to you by your health care provider. Make sure you discuss any questions you have with your health care provider. Document Released: 10/05/2014 Document Revised: 10/27/2015 Document Reviewed: 05/18/2014 Elsevier Interactive Patient Education  [employer]2018 [employer]vier Inc[employer]

## 2017-07-30 ENCOUNTER — Encounter (HOSPITAL_COMMUNITY): Payer: Self-pay

## 2017-07-30 ENCOUNTER — Emergency Department (HOSPITAL_COMMUNITY)
Admission: EM | Admit: 2017-07-30 | Discharge: 2017-07-30 | Disposition: A | Discharge status: Home / Self Care | Payer: Medicaid Other | Attending: Emergency Medicine | Admitting: Emergency Medicine

## 2017-07-30 DIAGNOSIS — F17210 Nicotine dependence, cigarettes, uncomplicated: Secondary | ICD-10-CM | POA: Insufficient documentation

## 2017-07-30 DIAGNOSIS — Y929 Unspecified place or not applicable: Secondary | ICD-10-CM | POA: Insufficient documentation

## 2017-07-30 DIAGNOSIS — Y999 Unspecified external cause status: Secondary | ICD-10-CM | POA: Insufficient documentation

## 2017-07-30 DIAGNOSIS — T23101A Burn of first degree of right hand, unspecified site, initial encounter: Secondary | ICD-10-CM

## 2017-07-30 DIAGNOSIS — X118XXA Contact with other hot tap-water, initial encounter: Secondary | ICD-10-CM | POA: Insufficient documentation

## 2017-07-30 DIAGNOSIS — Y939 Activity, unspecified: Secondary | ICD-10-CM | POA: Insufficient documentation

## 2017-07-30 DIAGNOSIS — T23162A Burn of first degree of back of left hand, initial encounter: Secondary | ICD-10-CM | POA: Insufficient documentation

## 2017-07-30 DIAGNOSIS — T23102A Burn of first degree of left hand, unspecified site, initial encounter: Secondary | ICD-10-CM

## 2017-07-30 DIAGNOSIS — T23161A Burn of first degree of back of right hand, initial encounter: Secondary | ICD-10-CM | POA: Insufficient documentation

## 2017-07-30 DIAGNOSIS — Z79899 Other long term (current) drug therapy: Secondary | ICD-10-CM | POA: Insufficient documentation

## 2017-07-30 MED ORDER — SILVER SULFADIAZINE 1 % EX CREA: 1.0000 "application " | TOPICAL_CREAM | Freq: Every day | CUTANEOUS | 0 refills | Status: DC

## 2017-07-30 MED ORDER — HYDROCORTISONE 2.5 % RE CREA: TOPICAL_CREAM | RECTAL | 0 refills | Status: DC

## 2017-07-30 MED ORDER — SILVER SULFADIAZINE 1 % EX CREA
TOPICAL_CREAM | Freq: Once | CUTANEOUS | Status: AC
  Administered 2017-07-30: 1 via TOPICAL
  Filled 2017-07-30: qty 85
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
## 2017-07-30 NOTE — Discharge Instructions (Signed)
As we discussed, keep the areas clean and dry.  He may gently wash the areas with soap and water.  Make sure that they are padded dry before applying the Silvadene cream.  Apply the Silvadene cream 2 times a day.  He can put bandages over the Silvadene cream.  As we discussed you need to follow-up with your primary care doctor in the next 2-4 days for further evaluation.  Return to the emergency department for fever, redness or swelling that extends from her hands up into your arms, difficulty moving her hands, numbness/weakness of your hands, discoloration of her hands are draining from the sites.

## 2017-07-30 NOTE — ED Notes (Signed)
See provider assessment 
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
## 2017-09-27 ENCOUNTER — Encounter: Payer: Self-pay | Admitting: Internal Medicine

## 2017-10-08 ENCOUNTER — Ambulatory Visit (HOSPITAL_COMMUNITY): Payer: Medicaid Other

## 2017-10-09 ENCOUNTER — Ambulatory Visit (HOSPITAL_COMMUNITY)
Admission: RE | Admit: 2017-10-09 | Discharge: 2017-10-09 | Disposition: A | Discharge status: Home / Self Care | Payer: Medicaid Other | Source: Ambulatory Visit | Attending: Internal Medicine | Admitting: Internal Medicine

## 2017-10-09 DIAGNOSIS — Z9049 Acquired absence of other specified parts of digestive tract: Secondary | ICD-10-CM | POA: Insufficient documentation

## 2017-10-09 DIAGNOSIS — B182 Chronic viral hepatitis C: Secondary | ICD-10-CM | POA: Insufficient documentation

## 2017-10-21 ENCOUNTER — Ambulatory Visit: Payer: Self-pay | Admitting: Family Medicine

## 2017-11-05 ENCOUNTER — Telehealth: Payer: Self-pay | Admitting: *Deleted

## 2017-11-05 NOTE — Telephone Encounter (Signed)
Great.  Maybe just have her come in to sign for Support Path.

## 2017-11-05 NOTE — Telephone Encounter (Signed)
Patient had her ultrasound on 5/8, now has stable housing, is ready for the next step. Please advise when/how to proceed with Hep C treatment. Andree CossHowell, Orlandus Borowski M, RN

## 2017-11-06 ENCOUNTER — Other Ambulatory Visit: Payer: Self-pay | Admitting: Pharmacist

## 2017-11-06 DIAGNOSIS — B182 Chronic viral hepatitis C: Secondary | ICD-10-CM

## 2017-11-06 MED ORDER — SOFOSBUVIR-VELPATASVIR 400-100 MG PO TABS: 1.0000 | ORAL_TABLET | Freq: Every day | ORAL | 2 refills | Status: DC

## 2017-11-06 NOTE — Telephone Encounter (Signed)
Looks like she is F3/F4 but compensated. [facility] can start the process for Epclusa x 12 weeks.

## 2017-11-06 NOTE — Progress Notes (Signed)
Patient now ready for Hep C treatment.  She has genotype 1a, F3/F4 CP class A (according to labs in December of 2018), no mass or nodule on ultrasound, and a viral load of 3.4 million. Will send in Epclusa x 12 weeks and will have [facility] start process for Support [facility].

## 2017-11-07 ENCOUNTER — Ambulatory Visit: Payer: Medicaid Other | Admitting: Pharmacy Technician

## 2017-11-13 ENCOUNTER — Ambulatory Visit: Payer: Self-pay | Admitting: Pharmacy Technician

## 2017-11-13 ENCOUNTER — Encounter: Payer: Self-pay | Admitting: Pharmacy Technician

## 2017-11-14 ENCOUNTER — Ambulatory Visit: Payer: Self-pay | Admitting: Pharmacy Technician

## 2017-11-15 ENCOUNTER — Telehealth: Payer: Self-pay | Admitting: Pharmacist Clinician (PhC)/ Clinical Pharmacy Specialist

## 2017-11-15 NOTE — Telephone Encounter (Signed)
Kelvin was asking if taking some tylenol or ibuprofen should be ok for her headache. Gave specific direction for both.

## 2017-12-04 ENCOUNTER — Ambulatory Visit (INDEPENDENT_AMBULATORY_CARE_PROVIDER_SITE_OTHER): Payer: Self-pay | Admitting: Pharmacist

## 2017-12-04 DIAGNOSIS — B182 Chronic viral hepatitis C: Secondary | ICD-10-CM

## 2017-12-04 NOTE — Progress Notes (Signed)
HPI: Debbie Johnson is a 52 y.o. female who presents to the [facility] pharmacy clinic for Hep C follow-up.  She has genotype 1a, F3/F4 with CP class A, and started 12 weeks of Epclusa ~6/15.  Patient Active Problem List   Diagnosis Date Noted  . Hepatitis C without hepatic coma 04/28/2017  . Polysubstance abuse (HCC) 03/29/2013  . Bipolar disorder (HCC) 03/29/2013  . Breast pain in female 07/22/2012    Patient's Medications  New Prescriptions   No medications on file  Previous Medications   ACETAMINOPHEN (TYLENOL) 500 MG TABLET    Take 1,000 mg by mouth every 6 (six) hours as needed for mild pain.   ARIPIPRAZOLE (ABILIFY) 10 MG TABLET    Take 10 mg by mouth daily.   GABAPENTIN (NEURONTIN) 300 MG CAPSULE    Take 1 capsule (300 mg total) 3 (three) times daily by mouth.   IBUPROFEN (ADVIL,MOTRIN) 800 MG TABLET    Take 1 tablet (800 mg total) by mouth every 8 (eight) hours as needed.   SILVER SULFADIAZINE (SILVADENE) 1 % CREAM    Apply 1 application topically daily.   SOFOSBUVIR-VELPATASVIR (EPCLUSA) 400-100 MG TABS    Take 1 tablet by mouth daily.   TRAZODONE (DESYREL) 50 MG TABLET    Take 50 mg by mouth at bedtime.  Modified Medications   No medications on file  Discontinued Medications   No medications on file    Allergies: Allergies  Allergen Reactions  . Codeine Nausea And Vomiting  . Septra [Sulfamethoxazole-Trimethoprim] Nausea And Vomiting and Rash    Past Medical History: Past Medical History:  Diagnosis Date  . Bipolar 1 disorder (HCC)   . Fibromyalgia     Social History: Social History   Socioeconomic History  . Marital status: Divorced    Spouse name: Not on file  . Number of children: Not on file  . Years of education: Not on file  . Highest education level: Not on file  Occupational History  . Not on file  Social Needs  . Financial resource strain: Not on file  . Food insecurity:    Worry: Not on file    Inability: Not on file  . Transportation  needs:    Medical: Not on file    Non-medical: Not on file  Tobacco Use  . Smoking status: Current Every Day Smoker    Packs/day: 0.50    Types: Cigarettes  . Smokeless tobacco: Never Used  Substance and Sexual Activity  . Alcohol use: No  . Drug use: No  . Sexual activity: Not on file  Lifestyle  . Physical activity:    Days per week: Not on file    Minutes per session: Not on file  . Stress: Not on file  Relationships  . Social connections:    Talks on phone: Not on file    Gets together: Not on file    Attends religious service: Not on file    Active member of club or organization: Not on file    Attends meetings of clubs or organizations: Not on file    Relationship status: Not on file  Other Topics Concern  . Not on file  Social History Narrative  . Not on file    Labs: Hepatitis C Lab Results  Component Value Date   HCVGENOTYPE 1a 05/15/2017   HCVRNAPCRQN 3,480,000 (H) 04/22/2017   FIBROSTAGE F2 05/15/2017   Hepatitis B Lab Results  Component Value Date   HEPBSAB NON-REACTIVE 05/15/2017  HEPBSAG NON-REACTIVE 05/15/2017   HEPBCAB NON-REACTIVE 05/15/2017   Hepatitis A Lab Results  Component Value Date   HAV REACTIVE (A) 05/15/2017   HIV Lab Results  Component Value Date   HIV NON-REACTIVE 05/15/2017   Lab Results  Component Value Date   CREATININE 0.52 05/15/2017   CREATININE 0.64 03/22/2017   CREATININE 0.60 03/29/2013   CREATININE 0.52 01/10/2007   Lab Results  Component Value Date   AST 73 (H) 05/15/2017   AST 75 (H) 03/22/2017   AST 36 03/29/2013   ALT 99 (H) 05/15/2017   ALT 96 (H) 05/15/2017   ALT 105 (H) 03/22/2017   INR 1.0 05/15/2017    Fibrosis Score: F3/F4 as assessed by [facility]   Child-Pugh Score: A  Assessment: Debbie Johnson is here today to follow-up with me for her Hep C infection.  She started Epclusa x 12 weeks on 6/15.  She has missed no doses since starting and takes it around 11am each morning. She does not have any  nausea/vomiting/diarrhea, but has had some headaches that she states are manageable.  Some fatigue but tolerable. Counseled again on the importance of compliance. I will check a Hep C VL and CMET today and have her come back and see Dr. Luciana Axeomer at end of therapy since she has cirrhosis.   Plan: - Continue Epclusa x 12 weeks - Hep C VL and CMET today - F/u with Dr. Luciana Axeomer 9/17 at 930am  Latiana Tomei L. Renso Swett, PharmD, AAHIVP, CPP Infectious Diseases Clinical Pharmacist [facility] 12/04/2017, 2:39 PM

## 2017-12-05 LAB — COMPREHENSIVE METABOLIC PANEL
AG RATIO: 1.4 (calc) (ref 1.0–2.5)
ALBUMIN MSPROF: 4 g/dL (ref 3.6–5.1)
ALKALINE PHOSPHATASE (APISO): 88 U/L (ref 33–130)
ALT: 10 U/L (ref 6–29)
AST: 14 U/L (ref 10–35)
BILIRUBIN TOTAL: 0.3 mg/dL (ref 0.2–1.2)
BUN: 10 mg/dL (ref 7–25)
CHLORIDE: 105 mmol/L (ref 98–110)
CO2: 28 mmol/L (ref 20–32)
Calcium: 9.3 mg/dL (ref 8.6–10.4)
Creat: 0.57 mg/dL (ref 0.50–1.05)
Globulin: 2.9 g/dL (calc) (ref 1.9–3.7)
Glucose, Bld: 117 mg/dL — ABNORMAL HIGH (ref 65–99)
POTASSIUM: 4.3 mmol/L (ref 3.5–5.3)
SODIUM: 139 mmol/L (ref 135–146)
TOTAL PROTEIN: 6.9 g/dL (ref 6.1–8.1)

## 2017-12-07 ENCOUNTER — Other Ambulatory Visit: Payer: Self-pay

## 2017-12-07 ENCOUNTER — Emergency Department (HOSPITAL_BASED_OUTPATIENT_CLINIC_OR_DEPARTMENT_OTHER)
Admission: EM | Admit: 2017-12-07 | Discharge: 2017-12-07 | Disposition: A | Discharge status: Home / Self Care | Payer: Medicaid Other | Attending: Emergency Medicine | Admitting: Emergency Medicine

## 2017-12-07 ENCOUNTER — Encounter (HOSPITAL_BASED_OUTPATIENT_CLINIC_OR_DEPARTMENT_OTHER): Payer: Self-pay | Admitting: Emergency Medicine

## 2017-12-07 DIAGNOSIS — F17210 Nicotine dependence, cigarettes, uncomplicated: Secondary | ICD-10-CM | POA: Insufficient documentation

## 2017-12-07 DIAGNOSIS — Z79899 Other long term (current) drug therapy: Secondary | ICD-10-CM | POA: Insufficient documentation

## 2017-12-07 DIAGNOSIS — N3001 Acute cystitis with hematuria: Secondary | ICD-10-CM | POA: Insufficient documentation

## 2017-12-07 LAB — HEPATITIS C RNA QUANTITATIVE
HCV QUANT LOG: 2 {Log_IU}/mL — AB
HCV RNA, PCR, QN: 100 IU/mL — ABNORMAL HIGH

## 2017-12-07 LAB — WET PREP, GENITAL
Clue Cells Wet Prep HPF POC: NONE SEEN
SPERM: NONE SEEN
TRICH WET PREP: NONE SEEN
Yeast Wet Prep HPF POC: NONE SEEN

## 2017-12-07 LAB — URINALYSIS, MICROSCOPIC (REFLEX): WBC, UA: >50 WBC/hpf (ref 0–5)

## 2017-12-07 LAB — URINALYSIS, ROUTINE W REFLEX MICROSCOPIC
BILIRUBIN URINE: NEGATIVE
Glucose, UA: NEGATIVE mg/dL
Ketones, ur: 15 mg/dL — AB
Nitrite: POSITIVE — AB
PROTEIN: 100 mg/dL — AB
pH: 6 (ref 5.0–8.0)

## 2017-12-07 LAB — PREGNANCY, URINE: PREG TEST UR: NEGATIVE

## 2017-12-07 MED ORDER — CEPHALEXIN 500 MG PO CAPS: 500.0000 mg | ORAL_CAPSULE | Freq: Two times a day (BID) | ORAL | 0 refills | Status: DC

## 2017-12-07 MED ORDER — PHENAZOPYRIDINE HCL 200 MG PO TABS: 200.0000 mg | ORAL_TABLET | Freq: Three times a day (TID) | ORAL | 0 refills | Status: DC

## 2017-12-07 MED ORDER — CEPHALEXIN 250 MG PO CAPS
500.0000 mg | ORAL_CAPSULE | Freq: Once | ORAL | Status: AC
  Administered 2017-12-07: 500 mg via ORAL
  Filled 2017-12-07: qty 2

## 2017-12-07 NOTE — ED Triage Notes (Signed)
Pt c/o dysuria and frequency x 1 week.

## 2017-12-07 NOTE — Discharge Instructions (Addendum)
Please read and follow all provided instructions You have been seen today for your complaint of pain with urination. Your lab work showed urine infection.  Your discharge medications include: 1) Keflex Please take all of your antibiotics until finished!   You may develop abdominal discomfort or diarrhea from the antibiotic.  You may help offset this with probiotics which you can buy or get in yogurt. Do not eat or take the probiotics until 2 hours after your antibiotic. Do not take your medicine if develop an itchy rash, swelling in your mouth or lips, or difficulty breathing.  2) Pyridium  This medication will help relieve pain and burning but does not treat the infection.  Make sure that you wear a panty liner as it may stain your underwear. Do not be alarmed if this turns your urine orange. To void upset stomach please take with food. Home care instructions are as follows:  1) Please drink plenty of water. Avoid tea and beverages with caffeine like coffee or soda 2) If you are sexually active, make sure to urinate immediately after intercourse.  Follow up:  Please follow up with your primary care physician in 1-2 days. If you do not have one please [facility] number listed above. Please seek immediate medical care if you develop any of the following symptoms: SEEK MEDICAL CARE IF:  You have back pain.  You develop a fever.  Your symptoms do not begin to resolve within 3 days.  SEEK IMMEDIATE MEDICAL CARE IF:  You have severe back pain or lower abdominal pain.  You develop chills.  You have nausea or vomiting.  You have continued burning or discomfort with urination even after completion of antibiotic. Additional Information:  Gonorrhea and Chlamydia cultures are pending.  You were not tested for HIV or syphilis today.  If you would like further testing of this please visit your primary care doctor or the health department.  Your results will take 48 hours to  complete.  Your vital signs today were: BP 112/74 (BP Location: Right Arm)    Pulse 96    Temp 98.6 F (37 C) (Oral)    Resp 18    Ht 5\' 4"  (1.626 m)    Wt 63.5 kg (140 lb)    SpO2 97%    BMI 24.03 kg/m  If your blood pressure (BP) was elevated above 135/85 this visit, please have this repeated by your doctor within one month. ---------------

## 2017-12-07 NOTE — ED Notes (Signed)
Patient stated that she thinks her Bartholin's duct is inflamed.

## 2017-12-07 NOTE — ED Notes (Signed)
ED Provider at bedside. 

## 2017-12-07 NOTE — ED Provider Notes (Signed)
[facility] EMERGENCY DEPARTMENT Provider Note   CSN: [id number] Arrival date & time: 12/07/17  1542     History   Chief Complaint Chief Complaint  Patient presents with  . Dysuria  . Urinary Frequency    HPI Debbie Johnson is a 52 y.o. female with a history of bipolar 1 disorder and fibromyalgia who presents emergency department today for her symptoms.  Patient states for the last 2-3 days she has been having suprapubic pressure, urinary urgency, urinary frequency, as well as dysuria.  She notes is consistent with her prior UTIs.  Patient reports that she has not taken anything for her symptoms. She reports that nothing makes her symptoms better or worse. Rates her current pain level as a 2/10. She describes this as pressure.  Patient denies any fever, nausea/vomiting, hematuria, pelvic pain or flank pain.  She denies history of kidney stones.  Patient is sexually active with one female partner over the last 6 months and states that they use protection on all occasions.  She states she has no concerns for STDs at this time.    HPI  Past Medical History:  Diagnosis Date  . Bipolar 1 disorder (HCC)   . Fibromyalgia     Patient Active Problem List   Diagnosis Date Noted  . Hepatitis C without hepatic coma 04/28/2017  . Polysubstance abuse (HCC) 03/29/2013  . Bipolar disorder (HCC) 03/29/2013  . Breast pain in female 07/22/2012    Past Surgical History:  Procedure Laterality Date  . BREAST SURGERY    . CHOLECYSTECTOMY    . NASAL SEPTUM SURGERY    . WISDOM TOOTH EXTRACTION       OB History    Gravida  6   Para      Term      Preterm      AB  4   Living  2     SAB  4   TAB      Ectopic      Multiple      Live Births               Home Medications    Prior to Admission medications   Medication Sig Start Date End Date Taking? Authorizing Provider  gabapentin (NEURONTIN) 300 MG capsule Take 1 capsule (300 mg total) 3 (three) times daily  by mouth. 04/22/17  Yes Hollis, Rosalene Billings, FNP  Sofosbuvir-Velpatasvir (EPCLUSA) 400-100 MG TABS Take 1 tablet by mouth daily. 11/06/17  Yes Kuppelweiser, Cassie L, RPH-CPP  traZODone (DESYREL) 50 MG tablet Take 50 mg by mouth at bedtime.   Yes [provider]  ibuprofen (ADVIL,MOTRIN) 800 MG tablet Take 1 tablet (800 mg total) by mouth every 8 (eight) hours as needed. 07/22/17   Massie Maroon, FNP    Family History Family History  Problem Relation Age of Onset  . Cancer Mother        lung, breast  . Hypertension Mother   . Diabetes Father   . Hypertension Father   . Hypertension Brother   . Hypertension Brother   . Hypertension Brother     Social History Social History   Tobacco Use  . Smoking status: Current Every Day Smoker    Packs/day: 0.50    Types: Cigarettes  . Smokeless tobacco: Never Used  Substance Use Topics  . Alcohol use: No  . Drug use: No    Comment: recovering addict     Allergies   Codeine and Septra [  sulfamethoxazole-trimethoprim]   Review of Systems Review of Systems  All other systems reviewed and are negative.    Physical Exam Updated Vital Signs BP 112/74 (BP Location: Right Arm)   Pulse 96   Temp 98.6 F (37 C) (Oral)   Resp 18   Ht 5\' 4"  (1.626 m)   Wt 63.5 kg (140 lb)   SpO2 97%   BMI 24.03 kg/m   Physical Exam  Constitutional: She appears well-developed and well-nourished.  HENT:  Head: Normocephalic and atraumatic.  Right Ear: External ear normal.  Left Ear: External ear normal.  Nose: Nose normal.  Mouth/Throat: Uvula is midline, oropharynx is clear and moist and mucous membranes are normal. No tonsillar exudate.  Eyes: Pupils are equal, round, and reactive to light. Right eye exhibits no discharge. Left eye exhibits no discharge. No scleral icterus.  Neck: Trachea normal. Neck supple. No spinous process tenderness present. No neck rigidity. Normal range of motion present.  Cardiovascular: Normal rate, regular  rhythm and intact distal pulses.  No murmur heard. Pulses:      Radial pulses are 2+ on the right side, and 2+ on the left side.       Dorsalis pedis pulses are 2+ on the right side, and 2+ on the left side.       Posterior tibial pulses are 2+ on the right side, and 2+ on the left side.  No lower extremity swelling or edema. Calves symmetric in size bilaterally.  Pulmonary/Chest: Effort normal and breath sounds normal. She exhibits no tenderness.  Abdominal: Soft. Bowel sounds are normal. She exhibits no distension. There is no tenderness. There is no rigidity, no rebound, no guarding and no CVA tenderness.  Genitourinary:  Genitourinary Comments: Exam performed by Jacinto HalimMichael M Migel Hannis, exam chaperoned Pelvic exam: normal external genitalia without evidence of trauma. VULVA: normal appearing vulva with no masses, tenderness or lesion. VAGINA: normal appearing vagina with normal color and discharge, no lesions. CERVIX: normal appearing cervix without lesions, cervical motion tenderness absent, cervical os closed with out purulent discharge; vaginal discharge - clear, Wet prep and DNA probe for chlamydia and GC obtained.   ADNEXA: normal adnexa in size, nontender and no masses UTERUS: uterus is normal size, shape, consistency and nontender.   Musculoskeletal: She exhibits no edema.  Lymphadenopathy:    She has no cervical adenopathy.  Neurological: She is alert.  Skin: Skin is warm and dry. No rash noted. She is not diaphoretic.  Psychiatric: She has a normal mood and affect.  Nursing note and vitals reviewed.    ED Treatments / Results  Labs (all labs ordered are listed, but only abnormal results are displayed) Labs Reviewed  WET PREP, GENITAL - Abnormal; Notable for the following components:      Result Value   WBC, Wet Prep HPF POC MODERATE (*)    All other components within normal limits  URINALYSIS, ROUTINE W REFLEX MICROSCOPIC - Abnormal; Notable for the following components:    APPearance CLOUDY (*)    Specific Gravity, Urine >1.030 (*)    Hgb urine dipstick MODERATE (*)    Ketones, ur 15 (*)    Protein, ur 100 (*)    Nitrite POSITIVE (*)    Leukocytes, UA MODERATE (*)    All other components within normal limits  URINALYSIS, MICROSCOPIC (REFLEX) - Abnormal; Notable for the following components:   Bacteria, UA MANY (*)    Non Squamous Epithelial PRESENT (*)    All other components within normal limits  URINE CULTURE  PREGNANCY, URINE  GC/CHLAMYDIA PROBE AMP ([place]) NOT AT [facility]    EKG None  Radiology No results found.  Procedures Procedures (including critical care time)  Medications Ordered in ED Medications - No data to display   Initial Impression / Assessment and Plan / ED Course  I have reviewed the triage vital signs and the nursing notes.  Pertinent labs & imaging results that were available during my care of the patient were reviewed by me and considered in my medical decision making (see chart for details).     52 year old female presenting with 2-3 days of suprapubic pressure, urinary urgency, urinary frequency as well as dysuria.  Patient's vital signs are reassuring on presentation.  She denies any fever, nausea/vomiting, flank pain, hematuria.  UA with evidence of UTI.  No prior urine cultures to review.  Urine culture sent.  No CVA tenderness.  No concern for pyelonephritis.  Patient denies history of kidney stones.  Do not feel she needs CT to evaluate.  Patient also complaining of inflamed Bartholin gland.  Pelvic exam performed without evidence of Bartholin cyst.  No concern for PID.  Patient will be discharged home on Keflex. Patient did not wish for HIV or RPR testing today. Told this can be done during follow up with PCP or at health department. I advised the patient to follow-up with PCP this week. Specific return precautions discussed. Time was given for all questions to be answered. The patient verbalized understanding and  agreement with plan. The patient appears safe for discharge home.  Final Clinical Impressions(s) / ED Diagnoses   Final diagnoses:  Acute cystitis with hematuria    ED Discharge Orders        Ordered    cephALEXin (KEFLEX) 500 MG capsule  2 times daily     12/07/17 1748    phenazopyridine (PYRIDIUM) 200 MG tablet  3 times daily     12/07/17 1748       Princella Pellegrini 12/07/17 1755    Tilden Fossa, MD 12/08/17 [phone]

## 2017-12-09 LAB — GC/CHLAMYDIA PROBE AMP (~~LOC~~) NOT AT ARMC
Chlamydia: NEGATIVE
Neisseria Gonorrhea: NEGATIVE

## 2017-12-10 LAB — URINE CULTURE

## 2017-12-11 ENCOUNTER — Telehealth: Payer: Self-pay | Admitting: Emergency Medicine

## 2017-12-11 NOTE — Telephone Encounter (Signed)
Post ED Visit - Positive Culture Follow-up  Culture report reviewed by antimicrobial stewardship pharmacist:  []  Enzo BiNathan Batchelder, Pharm.D. []  Celedonio MiyamotoJeremy Frens, Pharm.D., BCPS AQ-ID []  Garvin FilaMike Maccia, Pharm.D., BCPS []  Georgina PillionElizabeth Martin, Pharm.D., BCPS []  Walnut HillMinh Pham, [address] Rainbow BoulevardPharm.D., BCPS, AAHIVP []  Estella HuskMichelle Turner, Pharm.D., BCPS, AAHIVP [x]  Lysle Pearlachel Rumbarger, PharmD, BCPS []  Phillips Climeshuy Dang, PharmD, BCPS []  Agapito GamesAlison Masters, PharmD, BCPS []  Verlan FriendsErin Deja, PharmD  Positive urine culture Treated with cephalexin, organism sensitive to the same and no further patient follow-up is required at this time.  Berle MullMiller, Maveric Debono 12/11/2017, 10:56 AM

## 2017-12-16 ENCOUNTER — Encounter: Payer: Self-pay | Admitting: Internal Medicine

## 2017-12-16 NOTE — Telephone Encounter (Signed)
She is coming to pick it up today.

## 2017-12-17 ENCOUNTER — Encounter: Payer: Self-pay | Admitting: Internal Medicine

## 2017-12-18 NOTE — Telephone Encounter (Signed)
Kathie RhodesBetty - FYI

## 2018-02-18 ENCOUNTER — Ambulatory Visit: Payer: Self-pay | Admitting: Internal Medicine

## 2018-02-24 ENCOUNTER — Ambulatory Visit (INDEPENDENT_AMBULATORY_CARE_PROVIDER_SITE_OTHER): Payer: Self-pay | Admitting: Internal Medicine

## 2018-02-24 ENCOUNTER — Encounter: Payer: Self-pay | Admitting: Internal Medicine

## 2018-02-24 VITALS — BP 93/57 | HR 78 | Temp 97.9°F | Wt 153.8 lb

## 2018-02-24 DIAGNOSIS — Z7185 Encounter for immunization safety counseling: Secondary | ICD-10-CM | POA: Insufficient documentation

## 2018-02-24 DIAGNOSIS — Z7189 Other specified counseling: Secondary | ICD-10-CM

## 2018-02-24 DIAGNOSIS — Z23 Encounter for immunization: Secondary | ICD-10-CM

## 2018-02-24 DIAGNOSIS — B182 Chronic viral hepatitis C: Secondary | ICD-10-CM

## 2018-02-24 DIAGNOSIS — K7460 Unspecified cirrhosis of liver: Secondary | ICD-10-CM

## 2018-02-24 NOTE — Assessment & Plan Note (Signed)
I discussed vaccines and have offered and started the hepatitis B series.  She will return in 1 month with the nurse for #2.  I will give her the third shot in 6 months when she returns for follow-up.  She is hepatitis A immune.  She has had Pneumovax.

## 2018-02-24 NOTE — Assessment & Plan Note (Signed)
Advanced fibrosis noted on elastography though no overt cirrhosis on ultrasound.  I will refer her to gastroenterology for consideration of an EGD and [facility].

## 2018-02-24 NOTE — Assessment & Plan Note (Signed)
Doing well and will check her end of treatment viral load.  She will return in 6 months for final viral load check.

## 2018-02-24 NOTE — Addendum Note (Signed)
Addended by: Gerarda FractionHURTADO, Dariona Postma C on: 02/24/2018 04:44 PM   Modules accepted: Orders

## 2018-02-24 NOTE — Progress Notes (Signed)
   Subjective:    Patient ID: Debbie Johnson, female    DOB: 05/25/1966, 52 y.o.   MRN: [id number]  HPI She is here for follow-up of chronic hepatitis C. She has genotype 1a and started 12 weeks of Epclusa in mid June.  She had a little fatigue with the medication but otherwise had no significant issues.  She did not miss any doses.  She is pleased with the medication.  Her initial viral load after starting treatment was down to just 100 copies. 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  Her platelets are normal.  No cirrhosis noted on ultrasound.  She is not previously seen gastroenterology.  She continues to remain drug-free.   Review of Systems  Constitutional: Negative for unexpected weight change.  Skin: Negative for rash.  Neurological: Negative for dizziness.       Objective:   Physical Exam  Constitutional: She appears well-developed and well-nourished. No distress.  HENT:  Mouth/Throat: No oropharyngeal exudate.  Eyes: No scleral icterus.  Cardiovascular: Normal rate, regular rhythm and normal heart sounds.  No murmur heard. Pulmonary/Chest: Effort normal and breath sounds normal. No respiratory distress.  Skin: No rash noted.   SH: + tobacco, no alcohol       Assessment & Plan:

## 2018-02-26 LAB — COMPLETE METABOLIC PANEL WITHOUT GFR
AG Ratio: 1.2 (calc) (ref 1.0–2.5)
ALT: 14 U/L (ref 6–29)
AST: 19 U/L (ref 10–35)
Albumin: 3.9 g/dL (ref 3.6–5.1)
Alkaline phosphatase (APISO): 74 U/L (ref 33–130)
BUN: 16 mg/dL (ref 7–25)
CO2: 28 mmol/L (ref 20–32)
Calcium: 9.6 mg/dL (ref 8.6–10.4)
Chloride: 105 mmol/L (ref 98–110)
Creat: 0.63 mg/dL (ref 0.50–1.05)
GFR, Est African American: 119 mL/min/1.73m2
GFR, Est Non African American: 102 mL/min/1.73m2
Globulin: 3.2 g/dL (ref 1.9–3.7)
Glucose, Bld: 98 mg/dL (ref 65–99)
Potassium: 4.4 mmol/L (ref 3.5–5.3)
Sodium: 139 mmol/L (ref 135–146)
Total Bilirubin: 0.3 mg/dL (ref 0.2–1.2)
Total Protein: 7.1 g/dL (ref 6.1–8.1)

## 2018-02-26 LAB — HEPATITIS C RNA QUANTITATIVE
HCV QUANT LOG: NOT DETECTED {Log_IU}/mL
HCV RNA, PCR, QN: NOT DETECTED [IU]/mL

## 2018-03-05 ENCOUNTER — Other Ambulatory Visit: Payer: Self-pay

## 2018-03-06 ENCOUNTER — Encounter: Payer: Self-pay | Admitting: Gastroenterology

## 2018-03-26 ENCOUNTER — Ambulatory Visit: Payer: Medicaid Other

## 2018-04-09 ENCOUNTER — Ambulatory Visit: Payer: Self-pay | Admitting: Gastroenterology

## 2018-08-25 ENCOUNTER — Telehealth: Payer: Self-pay | Admitting: Internal Medicine

## 2018-08-25 ENCOUNTER — Ambulatory Visit: Payer: Medicaid Other | Admitting: Internal Medicine

## 2018-08-25 NOTE — Telephone Encounter (Signed)
Called patient to advise she needs the ultrasound then see dr comer in a few weeks not able to accept calls at this time

## 2018-08-25 NOTE — Telephone Encounter (Signed)
-----   Message from Gardiner Barefoot, MD sent at 08/24/2018  9:33 AM EDT ----- Let's have her get the ultrasound that was ordered and she did not get and then reschedule her with me for about 1-2 months.   thanks

## 2018-10-25 IMAGING — US US ABDOMEN COMPLETE W/ ELASTOGRAPHY
1 series · 13 of 13 positions shown · non-contrast
Comparison: None

CLINICAL DATA: Chronic hepatitis-C without hepatic coma



[Series 1: us abdomen complete w/ elastography · 0.19mm/px · 13 of 13 slices shown]
[im 1/13]
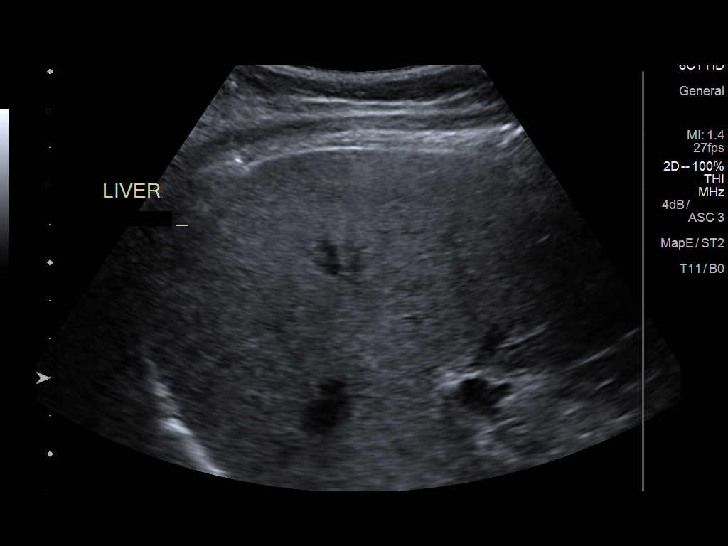
[im 2/13]
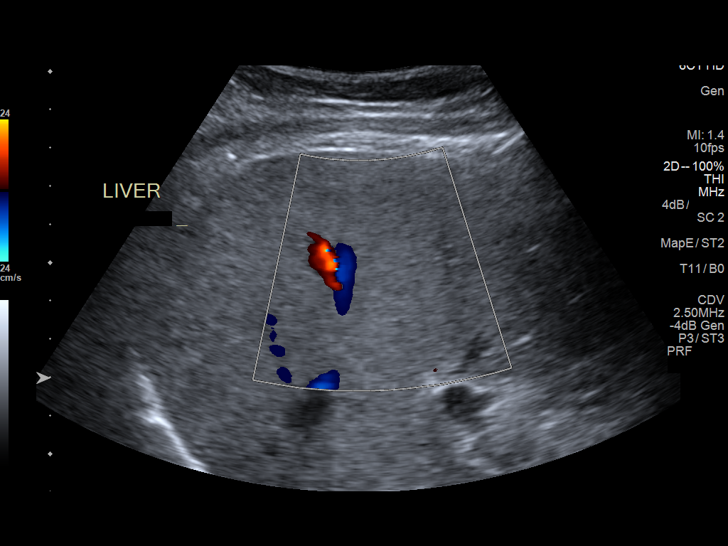
[im 3/13]
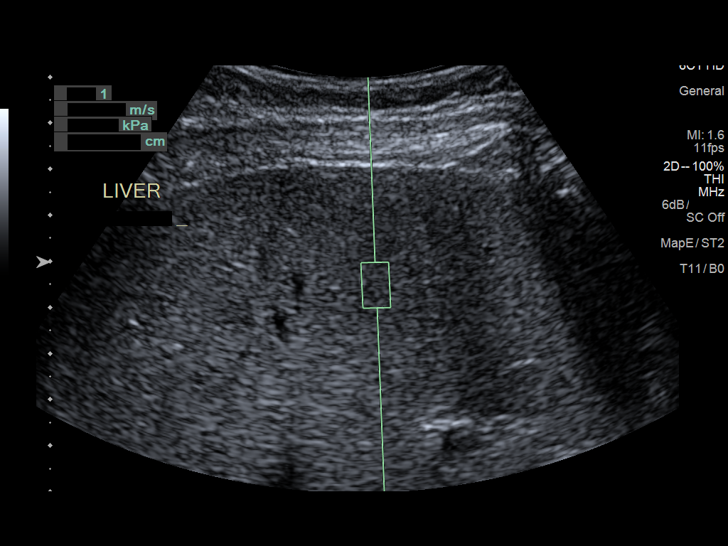
[im 4/13]
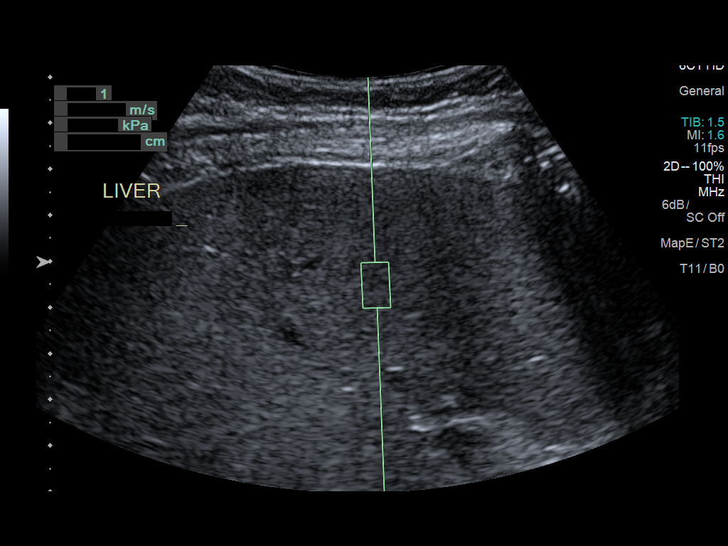
[im 5/13]
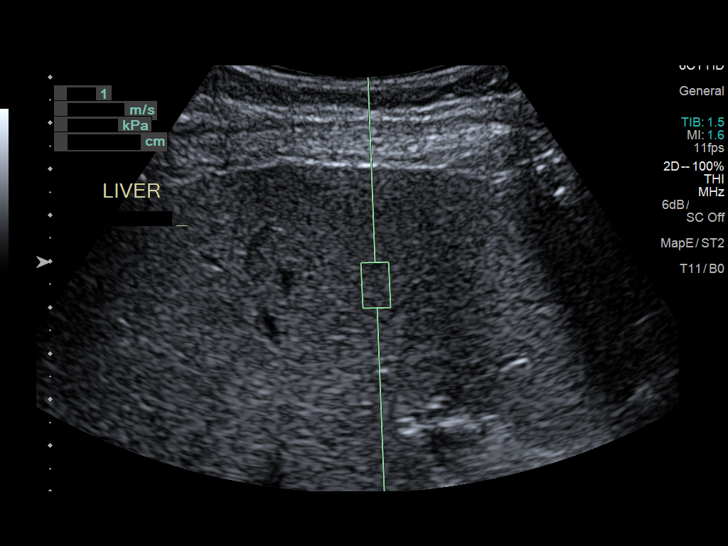
[im 6/13]
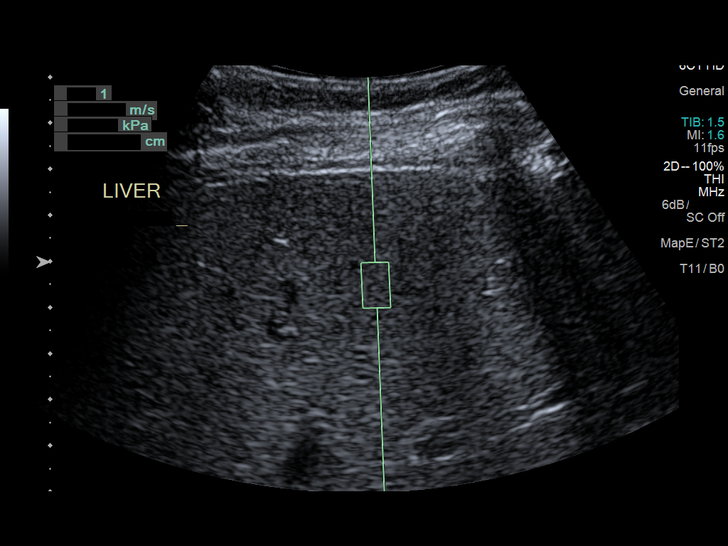
[im 7/13]
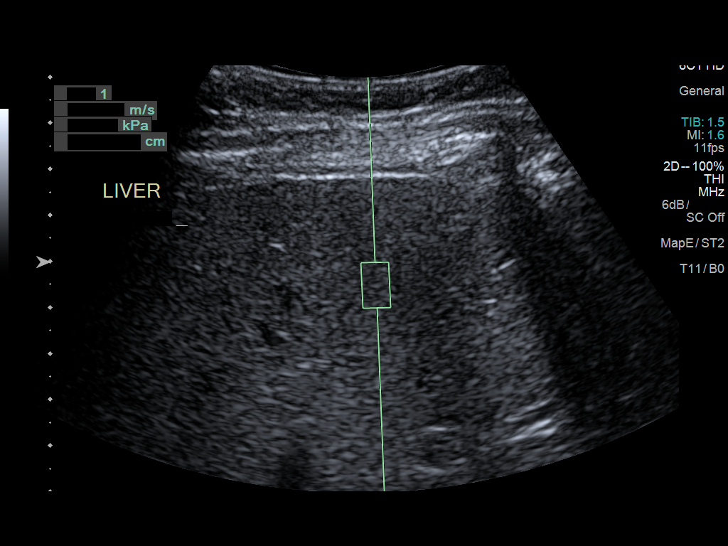
[im 8/13]
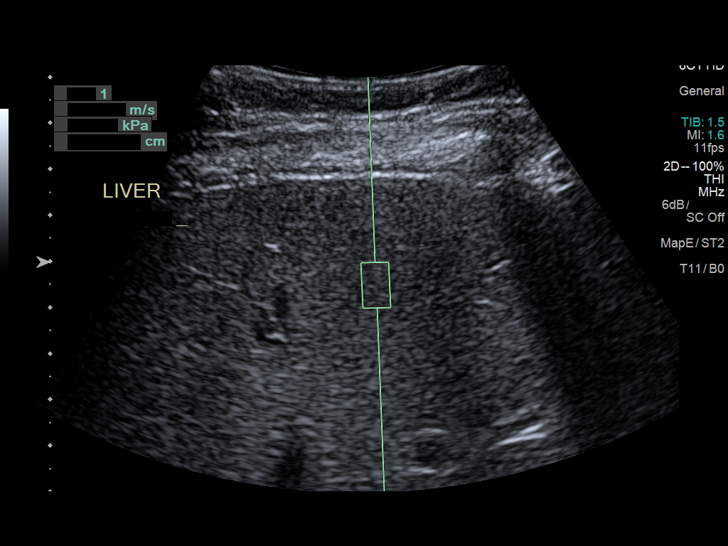
[im 9/13]
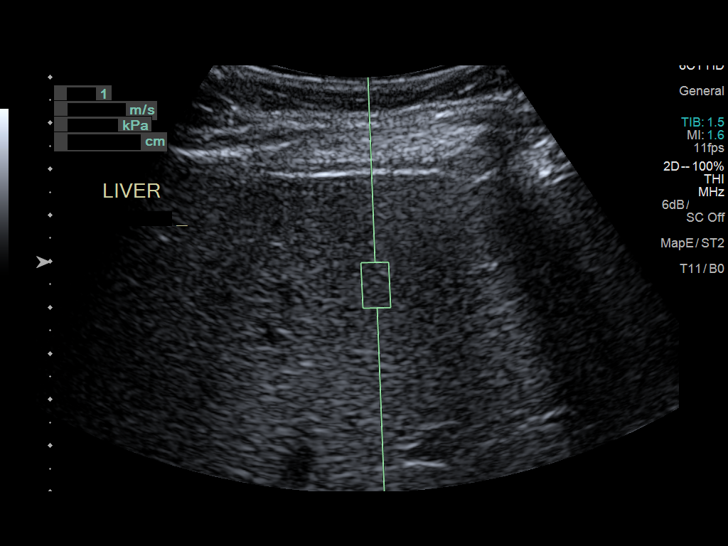
[im 10/13]
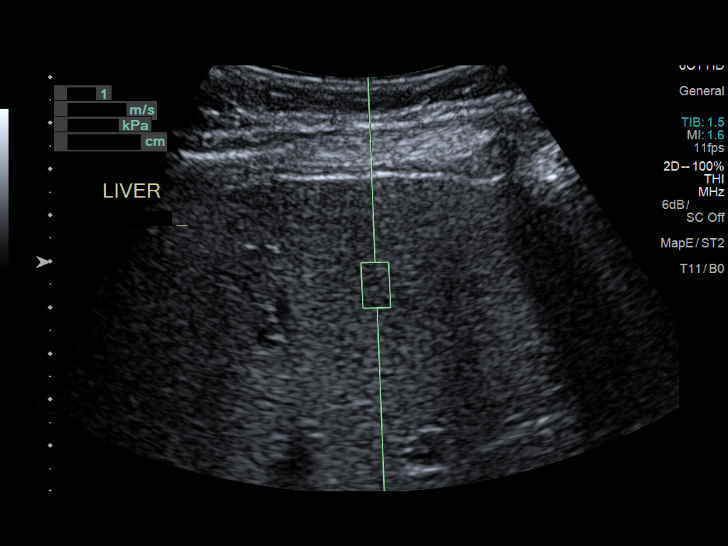
[im 11/13]
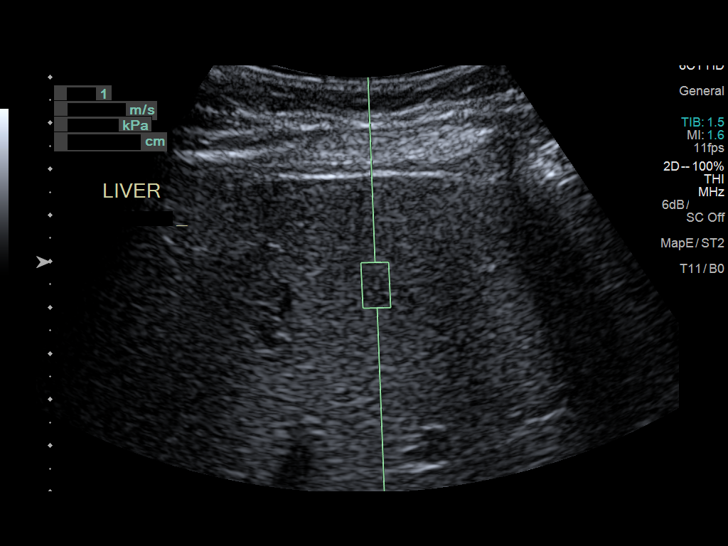
[im 12/13]
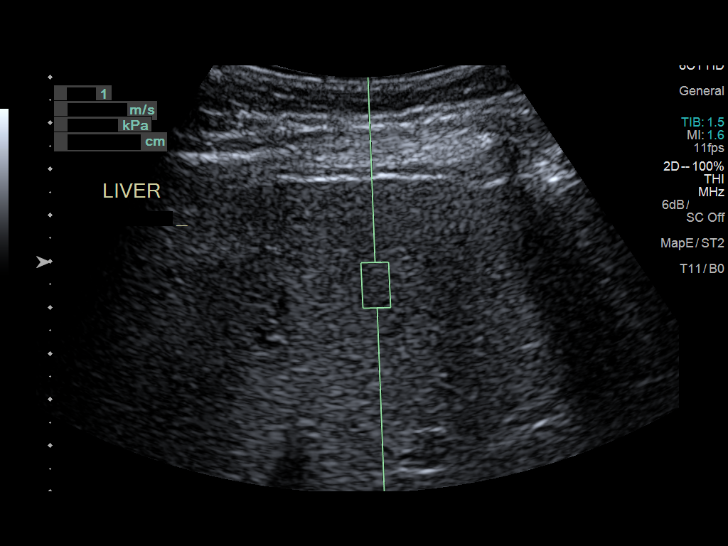
[im 13/13]
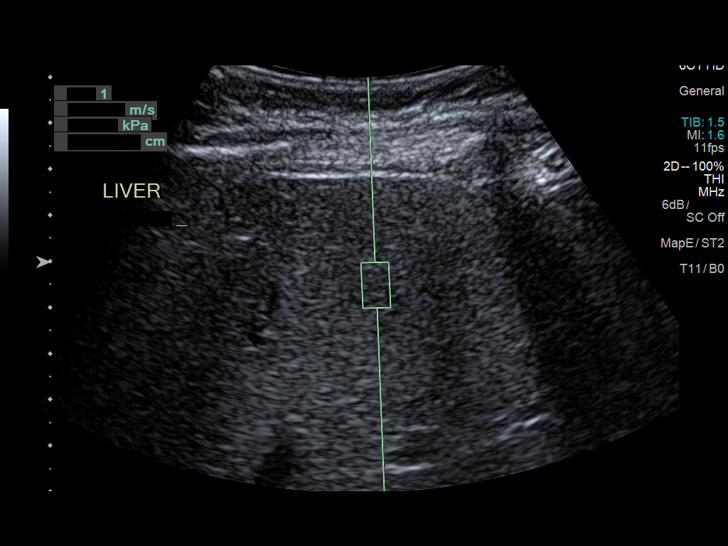

[13 of 13 positions shown; findings below may reference images not displayed]

FINDINGS: ULTRASOUND ABDOMEN

Gallbladder: Surgically absent

Common bile duct: Diameter: 7 mm diameter, normal post
cholecystectomy

Liver: Heterogeneous borderline increased echogenicity. No focal
hepatic mass or nodularity. Portal vein is patent on color Doppler
imaging with normal direction of blood flow towards the liver.

IVC: Normal appearance

Pancreas: Normal appearance

Spleen: Normal appearance, 6.8 cm length. Tiny splenule at splenic
hilum.

Right Kidney: Length: 11.4 cm. Normal morphology without mass or
hydronephrosis.

Left Kidney: Length: 11.8 cm. Normal morphology without mass or
hydronephrosis.

Abdominal aorta: Normal caliber

Other findings: No free fluid

ULTRASOUND HEPATIC ELASTOGRAPHY

Device: Siemens Helix VTQ

Patient position: Left Lateral Decubitus

Transducer 6C1

Number of measurements: 10

Hepatic segment:  8

Median velocity:   2.66 m/sec

IQR:

IQR/Median velocity ratio:

Corresponding Metavir fibrosis score:  Some F3 + F4

Risk of fibrosis: High

Limitations of exam: None

Please note that abnormal shear wave velocities may also be
identified in clinical settings other than with hepatic fibrosis,
such as: acute hepatitis, elevated right heart and central venous
pressures including use of beta blockers, Wf disease
(Dilfredo), infiltrative processes such as
mastocytosis/amyloidosis/infiltrative tumor, extrahepatic
cholestasis, in the post-prandial state, and liver transplantation.
Correlation with patient history, laboratory data, and clinical
condition recommended.
IMPRESSION: ULTRASOUND ABDOMEN:
Borderline increased hepatic echogenicity without discrete hepatic
mass lesion or nodularity.

Post cholecystectomy.

ULTRASOUND HEPATIC ELASTOGRAPHY:

Median hepatic shear wave velocity is calculated at 2.66 m/sec.

Corresponding Metavir fibrosis score is Some F3 + F4.

Risk of fibrosis is high.

Follow-up: Follow-up advised.

## 2020-06-28 ENCOUNTER — Encounter: Payer: Self-pay | Admitting: Family Medicine

## 2020-06-28 ENCOUNTER — Ambulatory Visit (INDEPENDENT_AMBULATORY_CARE_PROVIDER_SITE_OTHER): Payer: Self-pay | Admitting: Family Medicine

## 2020-06-28 ENCOUNTER — Other Ambulatory Visit: Payer: Self-pay

## 2020-06-28 VITALS — BP 122/67 | HR 94 | Temp 99.9°F | Ht 64.0 in | Wt 174.2 lb

## 2020-06-28 DIAGNOSIS — E669 Obesity, unspecified: Secondary | ICD-10-CM

## 2020-06-28 DIAGNOSIS — K7469 Other cirrhosis of liver: Secondary | ICD-10-CM

## 2020-06-28 DIAGNOSIS — K469 Unspecified abdominal hernia without obstruction or gangrene: Secondary | ICD-10-CM

## 2020-06-28 NOTE — Progress Notes (Unsigned)
[facility]   Established Patient Office Visit  Subjective:  Patient ID: Debbie Johnson, female    DOB: July 22, 1965  Age: 55 y.o. MRN: [id number]  CC:  Chief Complaint  Patient presents with  . Follow-up    Follow up     HPI Debbie Johnson is a 55 year old female with a medical history significant for hepatitis C on antiviral therapy that presents with complaints of abdominal hernia for greater than 6 months.  Patient is complaining of a protruding abdominal hernia that has been present for over 6 months.  Hernia has become uncomfortable especially with activity.  She currently denies abdominal pain, nausea, vomiting, diarrhea, or constipation.  Patient has not attempted any over-the-counter interventions to alleviate problem.  She has not had any previous evaluation for this problem.  Patient has not followed up due to financial/insurance constraints.   Past Medical History:  Diagnosis Date  . Bipolar 1 disorder (HCC)   . Fibromyalgia     Past Surgical History:  Procedure Laterality Date  . BREAST SURGERY    . CHOLECYSTECTOMY    . NASAL SEPTUM SURGERY    . WISDOM TOOTH EXTRACTION      Family History  Problem Relation Age of Onset  . Cancer Mother        lung, breast  . Hypertension Mother   . Diabetes Father   . Hypertension Father   . Hypertension Brother   . Hypertension Brother   . Hypertension Brother     Social History   Socioeconomic History  . Marital status: Divorced    Spouse name: Not on file  . Number of children: Not on file  . Years of education: Not on file  . Highest education level: Not on file  Occupational History  . Not on file  Tobacco Use  . Smoking status: Current Every Day Smoker    Packs/day: 0.50    Types: Cigarettes  . Smokeless tobacco: Never Used  Vaping Use  . Vaping Use: Never used  Substance and Sexual Activity  . Alcohol use: No  . Drug use: No    Comment:  recovering addict  . Sexual activity: Not on file  Other Topics Concern  . Not on file  Social History Narrative  . Not on file   Social Determinants of Health   Financial Resource Strain: Not on file  Food Insecurity: Not on file  Transportation Needs: Not on file  Physical Activity: Not on file  Stress: Not on file  Social Connections: Not on file  Intimate Partner Violence: Not on file    Outpatient Medications Prior to Visit  Medication Sig Dispense Refill  . cephALEXin (KEFLEX) 500 MG capsule Take 1 capsule (500 mg total) by mouth 2 (two) times daily. 14 capsule 0  . divalproex (DEPAKOTE) 500 MG DR tablet   3  . gabapentin (NEURONTIN) 300 MG capsule Take 1 capsule (300 mg total) 3 (three) times daily by mouth. 90 capsule 5  . ibuprofen (ADVIL,MOTRIN) 800 MG tablet Take 1 tablet (800 mg total) by mouth every 8 (eight) hours as needed. 30 tablet 0  . phenazopyridine (PYRIDIUM) 200 MG tablet Take 1 tablet (200 mg total) by mouth 3 (three) times daily. 6 tablet 0  . Sofosbuvir-Velpatasvir (EPCLUSA) 400-100 MG TABS Take 1 tablet by mouth daily. 28 tablet 2  . traZODone (DESYREL) 50 MG tablet Take 50 mg by mouth at bedtime.     No  facility-administered medications prior to visit.    Allergies  Allergen Reactions  . Codeine Nausea And Vomiting  . Septra [Sulfamethoxazole-Trimethoprim] Nausea And Vomiting and Rash    ROS Review of Systems  Constitutional: Negative for fatigue and fever.  HENT: Negative.   Eyes: Negative.   Respiratory: Negative.   Cardiovascular: Negative.   Gastrointestinal: Positive for abdominal distention and abdominal pain. Negative for diarrhea and nausea.  Endocrine: Negative.   Genitourinary: Negative.   Neurological: Negative.   Psychiatric/Behavioral: Negative.       Objective:    Physical Exam Abdominal:     Tenderness: There is abdominal tenderness in the periumbilical area.     Hernia: A hernia is present. Hernia is present in the  umbilical area.       BP 122/67 (BP Location: Left Arm, Patient Position: Sitting, Cuff Size: Small)   Pulse 94   Temp 99.9 F (37.7 C) (Temporal)   Ht 5\' 4"  (1.626 m)   Wt 174 lb 3.2 oz (79 kg)   SpO2 95%   BMI 29.90 kg/m  Wt Readings from Last 3 Encounters:  06/28/20 174 lb 3.2 oz (79 kg)  02/24/18 153 lb 12.8 oz (69.8 kg)  12/07/17 140 lb (63.5 kg)     Health Maintenance Due  Topic Date Due  . COVID-19 Vaccine (1) Never done  . COLONOSCOPY (Pts 45-77yrs Insurance coverage will need to be confirmed)  Never done  . MAMMOGRAM  10/31/2014  . PAP SMEAR-Modifier  07/23/2015    There are no preventive care reminders to display for this patient.  No results found for: TSH Lab Results  Component Value Date   WBC 6.8 05/15/2017   HGB 14.4 05/15/2017   HCT 42.3 05/15/2017   MCV 86.7 05/15/2017   PLT 222 05/15/2017   Lab Results  Component Value Date   NA 140 06/28/2020   K 4.5 06/28/2020   CO2 23 06/28/2020   GLUCOSE 94 06/28/2020   BUN 16 06/28/2020   CREATININE 0.69 06/28/2020   BILITOT 0.2 06/28/2020   ALKPHOS 75 06/28/2020   AST 18 06/28/2020   ALT 15 06/28/2020   PROT 7.1 06/28/2020   ALBUMIN 4.7 06/28/2020   CALCIUM 9.3 06/28/2020   No results found for: CHOL No results found for: HDL No results found for: LDLCALC No results found for: TRIG No results found for: CHOLHDL Lab Results  Component Value Date   HGBA1C 5.6 03/22/2017      Assessment & Plan:   Problem List Items Addressed This Visit      Digestive   Cirrhosis (HCC) - Primary   Relevant Orders   Comprehensive metabolic panel (Completed)    Other Visit Diagnoses    Obesity (BMI 30-39.9)       Hernia of abdominal cavity         1. Other cirrhosis of liver [facility]) Patient is followed by hepatology for hepatitis C treatment.  Advised to continue as scheduled. - Comprehensive metabolic panel  2. Obesity (BMI 30-39.9) The patient is asked to make an attempt to improve diet and  exercise patterns to aid in medical management of this problem.   3. Hernia of abdominal cavity Patient warrants follow up with general surgeon.  No orders of the defined types were placed in this encounter.   Follow-up: Return in about 3 months (around 09/26/2020).    09/28/2020  APRN, MSN, FNP-C [facility] [address] [place], [place]  [address] [phone]                     

## 2020-06-28 NOTE — Progress Notes (Signed)
[facility]ated [facility]al [facility] Case Management Referral Note  06/28/2020 Name: KRYSTALL KRUCKENBERG MRN: [id number] DOB: Jan 12, 1966 Yaniah A Mcaulay is a 55 y.o. year old female who sees [facility], Rosalene Billings, FNP for primary care. LCSW was consulted to assess patient's needs and assist the patient with Financial Difficulties related to lack of health coverage.  Interpreter: No.   Interpreter Name & Language: none  Assessment: Patient experiencing Financial constraints related to lack of health coverage. Patient reported she had an orange card and it expired. She needs to do a new application. She also wants to apply for [employer].   Intervention: Provided patient with [employer] and [employer]. Reviewed application and advised patient on supporting documents to be submitted with the application. Advised patient to follow up with CSW for assistance in scheduling appointment with financial counselor at [facility]) to submit the application if needed. Provided [facility] and CSW contact information.   Review of patient status, including review of consultants reports, relevant laboratory and other test results, and collaboration with appropriate care team members and the patient's provider was performed as part of comprehensive patient evaluation and provision of services.    SDOH (Social Determinants of Health) assessments performed: No    Goals Addressed   None      Follow up Plan: 1. CSW available from clinic as needed   Abigail Butts, LCSW [facility] [phone]

## 2020-06-28 NOTE — Patient Instructions (Addendum)
Small frequent meals throughout the day Increase fluid intake to about 32 ounces of water per day Increase daily physical activity, walk dogs about 20-30 minutes per day.     For abdominal hernia, I recommend abdominal binder for the time. Also, discuss insurance options with LCSW  A referral to general surgery for possible hernia repair  We will hold off on referral at this time due to insurance constraints   We will follow-up by phone with laboratory results    Hernia, Adult     A hernia happens when tissue inside your body pushes out through a weak spot in your belly muscles (abdominal wall). This makes a round lump (bulge). The lump may be:  In a scar from surgery that was done in your belly (incisional hernia).  Near your belly button (umbilical hernia).  In your groin (inguinal hernia). Your groin is the area where your leg meets your lower belly (abdomen). This kind of hernia could also be: ? In your scrotum, if you are female. ? In folds of skin around your vagina, if you are female.  In your upper thigh (femoral hernia).  Inside your belly (hiatal hernia). This happens when your stomach slides above the muscle between your belly and your chest (diaphragm). If your hernia is small and it does not cause pain, you may not need treatment. If your hernia is large or it causes pain, you may need surgery. Follow these instructions at home: Activity  Avoid stretching or overusing (straining) the muscles near your hernia. Straining can happen when you: ? Lift something heavy. ? Poop (have a bowel movement).  Do not lift anything that is heavier than 10 lb (4.5 kg), or the limit that you are told, until your doctor says that it is safe.  Use the strength of your legs when you lift something heavy. Do not use only your back muscles to lift. General instructions  Do these things if told by your doctor so you do not have trouble pooping (constipation): ? Drink enough fluid  to keep your pee (urine) pale yellow. ? Eat foods that are high in fiber. These include fresh fruits and vegetables, whole grains, and beans. ? Limit foods that are high in fat and processed sugars. These include foods that are fried or sweet. ? Take medicine for trouble pooping.  When you cough, try to cough gently.  You may try to push your hernia in by very gently pressing on it when you are lying down. Do not try to force the bulge back in if it will not push in easily.  If you are overweight, work with your doctor to lose weight safely.  Do not use any products that have nicotine or tobacco in them. These include cigarettes and e-cigarettes. If you need help quitting, ask your doctor.  If you will be having surgery (hernia repair), watch your hernia for changes in shape, size, or color. Tell your doctor if you see any changes.  Take over-the-counter and prescription medicines only as told by your doctor.  Keep all follow-up visits as told by your doctor. Contact a doctor if:  You get new pain, swelling, or redness near your hernia.  You poop fewer times in a week than normal.  You have trouble pooping.  You have poop (stool) that is more dry than normal.  You have poop that is harder or larger than normal. Get help right away if:  You have a fever.  You have belly pain  that gets worse.  You feel sick to your stomach (nauseous).  You throw up (vomit).  Your hernia cannot be pushed in by very gently pressing on it when you are lying down. Do not try to force the bulge back in if it will not push in easily.  Your hernia: ? Changes in shape or size. ? Changes color. ? Feels hard or it hurts when you touch it. These symptoms may represent a serious problem that is an emergency. Do not wait to see if the symptoms will go away. Get medical help right away. Call your local emergency services (911 in the [place] Summary  A hernia happens when tissue inside your body pushes  out through a weak spot in the belly muscles. This creates a bulge.  If your hernia is small and it does not hurt, you may not need treatment. If your hernia is large or it hurts, you may need surgery.  If you will be having surgery, watch your hernia for changes in shape, size, or color. Tell your doctor about any changes. This information is not intended to replace advice given to you by your health care provider. Make sure you discuss any questions you have with your health care provider. Document Revised: 09/11/2018 Document Reviewed: 02/20/2017 Elsevier Patient Education  2021 [employer].

## 2020-06-29 ENCOUNTER — Telehealth: Payer: Self-pay | Admitting: Family Medicine

## 2020-06-29 LAB — COMPREHENSIVE METABOLIC PANEL
ALT: 15 IU/L (ref 0–32)
AST: 18 IU/L (ref 0–40)
Albumin/Globulin Ratio: 2 (ref 1.2–2.2)
Albumin: 4.7 g/dL (ref 3.8–4.9)
Alkaline Phosphatase: 75 IU/L (ref 44–121)
BUN/Creatinine Ratio: 23 (ref 9–23)
BUN: 16 mg/dL (ref 6–24)
Bilirubin Total: 0.2 mg/dL (ref 0.0–1.2)
CO2: 23 mmol/L (ref 20–29)
Calcium: 9.3 mg/dL (ref 8.7–10.2)
Chloride: 103 mmol/L (ref 96–106)
Creatinine, Ser: 0.69 mg/dL (ref 0.57–1.00)
GFR calc Af Amer: 113 mL/min/{1.73_m2} (ref >59)
GFR calc non Af Amer: 98 mL/min/{1.73_m2} (ref >59)
Globulin, Total: 2.4 g/dL (ref 1.5–4.5)
Glucose: 94 mg/dL (ref 65–99)
Potassium: 4.5 mmol/L (ref 3.5–5.2)
Sodium: 140 mmol/L (ref 134–144)
Total Protein: 7.1 g/dL (ref 6.0–8.5)

## 2020-06-29 NOTE — Telephone Encounter (Signed)
-----   Message from Massie Maroon, [place] sent at 06/29/2020 12:24 PM EST ----- Regarding: lab results Please inform patient that all laboratory values are within normal limits.  Remind patient on the importance of decreasing weight.  Recommend small meals throughout the day.  Also, low-fat, low carbohydrates as discussed.  Increase daily water intake.  Decrease the amount of refined sugars, for example white rice, grits, oatmeal, pasta, cookies, cakes, and pies.  Continue to follow-up with social worker for assistance with payer source.   Nolon Nations  APRN, MSN, FNP-C Patient Care [facility] [address] [place], [place] [address] [phone]

## 2020-06-29 NOTE — Telephone Encounter (Signed)
Patient notified of results, verbally understood. No additional questions.

## 2020-09-27 ENCOUNTER — Ambulatory Visit (INDEPENDENT_AMBULATORY_CARE_PROVIDER_SITE_OTHER): Payer: Self-pay | Admitting: Family Medicine

## 2020-09-27 ENCOUNTER — Other Ambulatory Visit: Payer: Self-pay

## 2020-09-27 ENCOUNTER — Encounter: Payer: Self-pay | Admitting: Family Medicine

## 2020-09-27 VITALS — BP 120/57 | HR 81 | Temp 97.9°F | Ht 64.0 in | Wt 187.4 lb

## 2020-09-27 DIAGNOSIS — R339 Retention of urine, unspecified: Secondary | ICD-10-CM

## 2020-09-27 DIAGNOSIS — E669 Obesity, unspecified: Secondary | ICD-10-CM

## 2020-09-27 DIAGNOSIS — K5909 Other constipation: Secondary | ICD-10-CM

## 2020-09-27 DIAGNOSIS — F17200 Nicotine dependence, unspecified, uncomplicated: Secondary | ICD-10-CM

## 2020-09-27 DIAGNOSIS — R739 Hyperglycemia, unspecified: Secondary | ICD-10-CM

## 2020-09-27 DIAGNOSIS — E010 Iodine-deficiency related diffuse (endemic) goiter: Secondary | ICD-10-CM

## 2020-09-27 LAB — POCT GLYCOSYLATED HEMOGLOBIN (HGB A1C)
HbA1c POC (<> result, manual entry): 5.8 % (ref 4.0–5.6)
HbA1c, POC (controlled diabetic range): 5.8 % (ref 0.0–7.0)
HbA1c, POC (prediabetic range): 5.8 % (ref 5.7–6.4)
Hemoglobin A1C: 5.8 % — AB (ref 4.0–5.6)

## 2020-09-27 MED ORDER — OXYBUTYNIN CHLORIDE ER 5 MG PO TB24
5.0000 mg | ORAL_TABLET | Freq: Every day | ORAL | 3 refills | Status: DC
  Filled 2020-09-27: qty 30, 30d supply, fill #0

## 2020-09-27 MED ORDER — DOCUSATE SODIUM 100 MG PO CAPS
100.0000 mg | ORAL_CAPSULE | Freq: Every day | ORAL | 1 refills | Status: DC
  Filled 2020-09-27: qty 30, 30d supply, fill #0

## 2020-09-27 NOTE — Progress Notes (Signed)
[facility]  Established Patient Office Visit  Subjective:  Patient ID: Debbie Johnson, female    DOB: Nov 14, 1965  Age: 55 y.o. MRN: [id number]  CC:  Chief Complaint  Patient presents with  . Follow-up    3 month follow up , coughing, mucus in back of throat , not sure if is allergy, foot fungus on rt       HPI Debbie Johnson is a 55 year old female with a medical history significant for hepatitis C on antiviral therapy that presents for follow-up of chronic conditions.  Patient is also complaining of increased weight gain, constipation, and urinary incontinence.  Patient reports that she is gained a significant amount of weight over the past several months.  She has not been following a low-fat, low carbohydrate diet as recommended during her previous appointment.  Also, patient does not exercise.  She is mostly sedentary.  Also, has a very unhealthy diet at this time. Patient complains of some urinary incontinence and bladder spasms.  She reports periods of urinating with ambulation.  Also, patient urinates with coughing, laughing, etc.  She denies any hematuria, dysuria, urinary frequency, or urgency. Patient is also complaining of some constipation.  She says that she has bowel movements about 2-3 times per week.  Bowel movements are typically very firm, sometimes hard.  She reports some straining with defecation.  She denies rectal bleeding, melena, or hematochezia.  Past Medical History:  Diagnosis Date  . Bipolar 1 disorder (HCC)   . Fibromyalgia     Past Surgical History:  Procedure Laterality Date  . BREAST SURGERY    . CHOLECYSTECTOMY    . NASAL SEPTUM SURGERY    . WISDOM TOOTH EXTRACTION      Family History  Problem Relation Age of Onset  . Cancer Mother        lung, breast  . Hypertension Mother   . Diabetes Father   . Hypertension Father   . Hypertension Brother   . Hypertension Brother   . Hypertension  Brother     Social History   Socioeconomic History  . Marital status: Divorced    Spouse name: Not on file  . Number of children: Not on file  . Years of education: Not on file  . Highest education level: Not on file  Occupational History  . Not on file  Tobacco Use  . Smoking status: Current Every Day Smoker    Packs/day: 0.50    Types: Cigarettes  . Smokeless tobacco: Never Used  Vaping Use  . Vaping Use: Never used  Substance and Sexual Activity  . Alcohol use: No  . Drug use: No    Comment: recovering addict  . Sexual activity: Not on file  Other Topics Concern  . Not on file  Social History Narrative  . Not on file   Social Determinants of Health   Financial Resource Strain: Not on file  Food Insecurity: Not on file  Transportation Needs: Not on file  Physical Activity: Not on file  Stress: Not on file  Social Connections: Not on file  Intimate Partner Violence: Not on file    Outpatient Medications Prior to Visit  Medication Sig Dispense Refill  . divalproex (DEPAKOTE) 500 MG DR tablet   3  . gabapentin (NEURONTIN) 300 MG capsule Take 1 capsule (300 mg total) 3 (three) times daily by mouth. 90 capsule 5  . phenazopyridine (PYRIDIUM) 200 MG tablet Take 1 tablet (200  mg total) by mouth 3 (three) times daily. 6 tablet 0  . Sofosbuvir-Velpatasvir (EPCLUSA) 400-100 MG TABS Take 1 tablet by mouth daily. 28 tablet 2  . traZODone (DESYREL) 50 MG tablet Take 50 mg by mouth at bedtime.    Marland Kitchen ibuprofen (ADVIL,MOTRIN) 800 MG tablet Take 1 tablet (800 mg total) by mouth every 8 (eight) hours as needed. (Patient not taking: Reported on 09/27/2020) 30 tablet 0  . cephALEXin (KEFLEX) 500 MG capsule Take 1 capsule (500 mg total) by mouth 2 (two) times daily. 14 capsule 0   No facility-administered medications prior to visit.    Allergies  Allergen Reactions  . Codeine Nausea And Vomiting  . Septra [Sulfamethoxazole-Trimethoprim] Nausea And Vomiting and Rash     ROS Review of Systems  Constitutional: Positive for unexpected weight change (weight gain). Negative for activity change and appetite change.  HENT: Negative.   Eyes: Negative.   Respiratory: Negative.   Cardiovascular: Negative.   Gastrointestinal: Positive for abdominal distention and constipation.  Genitourinary: Negative.   Musculoskeletal: Positive for arthralgias and back pain.  Skin: Negative.   Psychiatric/Behavioral: Negative.       Objective:    Physical Exam Constitutional:      Appearance: She is obese.  HENT:     Mouth/Throat:     Mouth: Mucous membranes are moist.     Pharynx: Oropharynx is clear.  Eyes:     Pupils: Pupils are equal, round, and reactive to light.  Neck:     Thyroid: Thyromegaly present.  Cardiovascular:     Rate and Rhythm: Normal rate and regular rhythm.     Pulses: Normal pulses.  Pulmonary:     Effort: Pulmonary effort is normal.  Abdominal:     General: Bowel sounds are normal.  Musculoskeletal:        General: Normal range of motion.  Skin:    General: Skin is warm.  Neurological:     General: No focal deficit present.     Mental Status: Mental status is at baseline.     BP (!) 120/57 (BP Location: Left Arm, Patient Position: Sitting, Cuff Size: Large)   Pulse 81   Temp 97.9 F (36.6 C) (Temporal)   Ht 5\' 4"  (1.626 m)   Wt 187 lb 6.4 oz (85 kg)   SpO2 95%   BMI 32.17 kg/m  Wt Readings from Last 3 Encounters:  09/27/20 187 lb 6.4 oz (85 kg)  06/28/20 174 lb 3.2 oz (79 kg)  02/24/18 153 lb 12.8 oz (69.8 kg)     There are no preventive care reminders to display for this patient.  There are no preventive care reminders to display for this patient.  No results found for: TSH Lab Results  Component Value Date   WBC 6.8 05/15/2017   HGB 14.4 05/15/2017   HCT 42.3 05/15/2017   MCV 86.7 05/15/2017   PLT 222 05/15/2017   Lab Results  Component Value Date   NA 140 06/28/2020   K 4.5 06/28/2020   CO2 23  06/28/2020   GLUCOSE 94 06/28/2020   BUN 16 06/28/2020   CREATININE 0.69 06/28/2020   BILITOT 0.2 06/28/2020   ALKPHOS 75 06/28/2020   AST 18 06/28/2020   ALT 15 06/28/2020   PROT 7.1 06/28/2020   ALBUMIN 4.7 06/28/2020   CALCIUM 9.3 06/28/2020   No results found for: CHOL No results found for: HDL No results found for: LDLCALC No results found for: TRIG No results found for: CHOLHDL  Lab Results  Component Value Date   HGBA1C 5.6 03/22/2017      Assessment & Plan:   Problem List Items Addressed This Visit   None   Visit Diagnoses    Thyromegaly    -  Primary   Relevant Orders   Thyroid Panel With TSH   Obesity (BMI 30-39.9)       Hyperglycemia       Relevant Orders   HgB A1c   Urinary retention       Relevant Medications   oxybutynin (DITROPAN XL) 5 MG 24 hr tablet   Other Relevant Orders   Urinalysis Dipstick   Other constipation       Relevant Medications   docusate sodium (COLACE) 100 MG capsule   Tobacco dependence          Meds ordered this encounter  Medications  . docusate sodium (COLACE) 100 MG capsule    Sig: Take 1 capsule (100 mg total) by mouth daily.    Dispense:  30 capsule    Refill:  1    Order Specific Question:   Supervising Provider    Answer:   Quentin Angst [id number]  . oxybutynin (DITROPAN XL) 5 MG 24 hr tablet    Sig: Take 1 tablet (5 mg total) by mouth at bedtime.    Dispense:  30 tablet    Refill:  3    Order Specific Question:   Supervising Provider    Answer:   Jeanann Lewandowsky E [id number]   1. Thyromegaly - Thyroid Panel With TSH  2. Obesity (BMI 30-39.9) The patient is asked to make an attempt to improve diet and exercise patterns to aid in medical management of this problem.   3. Hyperglycemia - HgB A1c  4. Urinary retention - oxybutynin (DITROPAN XL) 5 MG 24 hr tablet; Take 1 tablet (5 mg total) by mouth at bedtime.  Dispense: 30 tablet; Refill: 3  5. Other constipation - docusate sodium (COLACE) 100  MG capsule; Take 1 capsule (100 mg total) by mouth daily.  Dispense: 30 capsule; Refill: 1  6. Tobacco dependence Smoking cessation instruction/counseling given:  counseled patient on the dangers of tobacco use, advised patient to stop smoking, and reviewed strategies to maximize success  Follow-up: Return in about 3 months (around 12/27/2020).    Nolon Nations  APRN, MSN, FNP-C Patient Care [facility] [address] [place], [place] [address] [phone]

## 2020-09-27 NOTE — Patient Instructions (Signed)
Acute Urinary Retention, Female  Acute urinary retention is when a person cannot pee (urinate) at all, or can only pee a little. This can come on all of a sudden. If it is not treated, it can lead to kidney problems or other serious problems. What are the causes?  A problem with the tube that drains the bladder (urethra).  Problems with the nerves in the bladder.  The organs in the area between your hip bones (pelvis) slipping out of place (prolapse).  Tumors.  The birth of a baby through the vagina.  An infection.  Having trouble pooping (constipation).  Certain medicines. What increases the risk? Women over age 50 are more at risk. Other conditions also can increase risk. These include:  Diseases, such as multiple sclerosis.  Injury to the spinal cord.  Diabetes.  A condition that affects the way the brain works, such as dementia.  Holding back urine due to trauma or because you do not want to use the bathroom.  History of not being able to pee or peeing too little.  Having had surgery in the area between your hip bones. What are the signs or symptoms?  Trouble peeing.  Pain in the lower belly. How is this treated? Treatment for this condition may include:  Medicines.  Placing a thin, germ-free tube (catheter) into the bladder to drain pee out of the body.  Therapy to treat mental health conditions.  Treatment for conditions that may cause this. If needed, you may be treated in the hospital for kidney problems or to manage other problems. Follow these instructions at home: Medicines  Take over-the-counter and prescription medicines only as told by your doctor. Ask your doctor what medicines you should stay away from.  If you were given an antibiotic medicine, take it as told by your doctor. Do not stop taking it even if you start to feel better. General instructions  Do not smoke or use any products that contain nicotine or tobacco. If you need help  quitting, ask your doctor.  Drink enough fluid to keep your pee pale yellow.  If you were sent home with a tube that drains the bladder, take care of it as told by your doctor.  Watch for changes in your symptoms. Tell your doctor about them.  If told, keep track of changes in your blood pressure at home. Tell your doctor about them.  Keep all follow-up visits. Contact a doctor if:  You have spasms in your bladder that you cannot stop.  You leak pee when you have spasms. Get help right away if:  You have chills or a fever.  You have blood in your pee.  You have a tube that drains pee from the bladder and these things happen: ? The tube stops draining pee. ? The tube falls out. Summary  Acute urinary retention is when you cannot pee at all or you pee too little.  If this is not treated, it can cause kidney problems or other serious problems.  If you were sent home with a tube (catheter) that drains pee from the bladder, take care of it as told by your doctor.  Watch for changes in your symptoms. Tell your doctor about them. This information is not intended to replace advice given to you by your health care provider. Make sure you discuss any questions you have with your health care provider. Document Revised: 02/10/2020 Document Reviewed: 02/10/2020 Elsevier Patient Education  2021 [employer] Inc[employer]  

## 2020-09-28 LAB — THYROID PANEL WITH TSH
Free Thyroxine Index: 2.2 (ref 1.2–4.9)
T3 Uptake Ratio: 27 % (ref 24–39)
T4, Total: 8.1 ug/dL (ref 4.5–12.0)
TSH: 1.35 u[IU]/mL (ref 0.450–4.500)

## 2020-09-29 ENCOUNTER — Telehealth: Payer: Self-pay | Admitting: Family Medicine

## 2020-09-29 NOTE — Telephone Encounter (Signed)
Debbie Johnson is a 55 year old female with a medical history significant for hepatitis C, cirrhosis, and history of polysubstance abuse presents for follow-up. Reviewed laboratory values. Thyroid panel is within a normal range.  Inform patient that hemoglobin A1c is 5.8, which is consistent with prediabetes.  Recommend a low-fat, low carbohydrate diet divided over small meals throughout the day.  Increase daily physical activity, about 150 minutes of cardiovascular exercise per day which can be of brisk walk.  Follow-up in office as scheduled   Nolon Nations  APRN, MSN, FNP-C Patient Care [facility] [address] [place], [place] [address] [phone]

## 2020-10-04 ENCOUNTER — Other Ambulatory Visit: Payer: Self-pay

## 2020-12-27 ENCOUNTER — Ambulatory Visit: Payer: Self-pay | Admitting: Family Medicine

## 2021-02-13 ENCOUNTER — Ambulatory Visit: Payer: Self-pay | Admitting: Nurse Practitioner

## 2021-08-06 ENCOUNTER — Telehealth: Payer: Medicaid Other | Admitting: Family

## 2021-08-06 DIAGNOSIS — R399 Unspecified symptoms and signs involving the genitourinary system: Secondary | ICD-10-CM

## 2021-08-06 MED ORDER — CEPHALEXIN 500 MG PO CAPS: 500.0000 mg | ORAL_CAPSULE | Freq: Two times a day (BID) | ORAL | 0 refills | Status: DC

## 2021-08-06 NOTE — Progress Notes (Signed)
?Virtual Visit Consent  ? ?Debbie Johnson, you are scheduled for a virtual visit with a [facility] provider today.   ?  ?Just as with appointments in the office, your consent must be obtained to participate.  Your consent will be active for this visit and any virtual visit you may have with one of our providers in the next 365 days.   ?  ?If you have a MyChart account, a copy of this consent can be sent to you electronically.  All virtual visits are billed to your insurance company just like a traditional visit in the office.   ? ?As this is a virtual visit, video technology does not allow for your provider to perform a traditional examination.  This may limit your provider's ability to fully assess your condition.  If your provider identifies any concerns that need to be evaluated in person or the need to arrange testing (such as labs, EKG, etc.), we will make arrangements to do so.   ?  ?Although advances in technology are sophisticated, we cannot ensure that it will always work on either your end or our end.  If the connection with a video visit is poor, the visit may have to be switched to a telephone visit.  With either a video or telephone visit, we are not always able to ensure that we have a secure connection.    ? ?I need to obtain your verbal consent now.   Are you willing to proceed with your visit today?  ?  ?Debbie Johnson has provided verbal consent on 08/06/2021 for a virtual visit (video or telephone). ?  ?Jannifer Rodney, FNP  ? ?Date: 08/06/2021 6:50 PM ? ? ?Virtual Visit via Video Note  ? ?IJannifer Rodney, connected with  Debbie Johnson  ([id number], 1965/11/23) on 08/06/21 at  6:45 PM EST by a video-enabled telemedicine application and verified that I am speaking with the correct person using two identifiers. ? ?Location: ?Patient: Virtual Visit Location Patient: Home ?Provider: Virtual Visit Location Provider: Office/Clinic ?  ?I discussed the limitations of evaluation and management by  telemedicine and the availability of in person appointments. The patient express2ed understanding and agreed to proceed.   ? ?History of Present Illness: ?Debbie Johnson is a 56 y.o. who identifies as a female who was assigned female at birth, and is being seen today for UTI symptoms that started two days ago. ? ?HPI: Urinary Frequency  ?This is a new problem. The current episode started in the past 7 days. The problem has been gradually worsening. The patient is experiencing no pain. Associated symptoms include frequency, hesitancy and urgency. Pertinent negatives include no discharge, flank pain, hematuria, nausea or vomiting. She has tried increased fluids for the symptoms. The treatment provided mild relief.   ?Problems:  ?Patient Active Problem List  ? Diagnosis Date Noted  ? Cirrhosis (HCC) 02/24/2018  ? Vaccine counseling 02/24/2018  ? Hepatitis C without hepatic coma 04/28/2017  ? Polysubstance abuse (HCC) 03/29/2013  ? Bipolar disorder (HCC) 03/29/2013  ? Breast pain in female 07/22/2012  ?  ?Allergies:  ?Allergies  ?Allergen Reactions  ? Codeine Nausea And Vomiting  ? Septra [Sulfamethoxazole-Trimethoprim] Nausea And Vomiting and Rash  ? ?Medications:  ?Current Outpatient Medications:  ?  cephALEXin (KEFLEX) 500 MG capsule, Take 1 capsule (500 mg total) by mouth 2 (two) times daily., Disp: 14 capsule, Rfl: 0 ?  divalproex (DEPAKOTE) 500 MG DR tablet, , Disp: , Rfl: 3 ?  docusate  sodium (COLACE) 100 MG capsule, Take 1 capsule (100 mg total) by mouth daily., Disp: 30 capsule, Rfl: 1 ?  gabapentin (NEURONTIN) 300 MG capsule, Take 1 capsule (300 mg total) 3 (three) times daily by mouth., Disp: 90 capsule, Rfl: 5 ?  ibuprofen (ADVIL,MOTRIN) 800 MG tablet, Take 1 tablet (800 mg total) by mouth every 8 (eight) hours as needed. (Patient not taking: Reported on 09/27/2020), Disp: 30 tablet, Rfl: 0 ?  oxybutynin (DITROPAN XL) 5 MG 24 hr tablet, Take 1 tablet (5 mg total) by mouth at bedtime., Disp: 30 tablet, Rfl:  3 ?  phenazopyridine (PYRIDIUM) 200 MG tablet, Take 1 tablet (200 mg total) by mouth 3 (three) times daily., Disp: 6 tablet, Rfl: 0 ?  Sofosbuvir-Velpatasvir (EPCLUSA) 400-100 MG TABS, Take 1 tablet by mouth daily., Disp: 28 tablet, Rfl: 2 ?  traZODone (DESYREL) 50 MG tablet, Take 50 mg by mouth at bedtime., Disp: , Rfl:  ? ?Observations/Objective: ?Patient is well-developed, well-nourished in no acute distress.  ?Resting comfortably  at home.  ?Head is normocephalic, atraumatic.  ?No labored breathing.  ?Speech is clear and coherent with logical content.  ?Patient is alert and oriented at baseline.  ? ? ?Assessment and Plan: ?1. UTI symptoms ?- cephALEXin (KEFLEX) 500 MG capsule; Take 1 capsule (500 mg total) by mouth 2 (two) times daily.  Dispense: 14 capsule; Refill: 0 ? ?Force fluids ?AZO over the counter X2 days ?RTO if symptoms worsen or do not improve ?Follow Up Instructions: ?I discussed the assessment and treatment plan with the patient. The patient was provided an opportunity to ask questions and all were answered. The patient agreed with the plan and demonstrated an understanding of the instructions.  A copy of instructions were sent to the patient via MyChart unless otherwise noted below.  ? ? ? ?The patient was advised to call back or seek an in-person evaluation if the symptoms worsen or if the condition fails to improve as anticipated. ? ?Time:  ?I spent 6 minutes with the patient via telehealth technology discussing the above problems/concerns.   ? ?Jannifer Rodney, FNP ? ?

## 2021-09-22 ENCOUNTER — Encounter: Payer: Self-pay | Admitting: Nurse Practitioner

## 2021-09-22 ENCOUNTER — Ambulatory Visit (INDEPENDENT_AMBULATORY_CARE_PROVIDER_SITE_OTHER): Payer: 59 | Admitting: Nurse Practitioner

## 2021-09-22 VITALS — BP 104/68 | HR 81 | Temp 98.1°F | Ht 64.0 in | Wt 158.6 lb

## 2021-09-22 DIAGNOSIS — B351 Tinea unguium: Secondary | ICD-10-CM

## 2021-09-22 DIAGNOSIS — K429 Umbilical hernia without obstruction or gangrene: Secondary | ICD-10-CM

## 2021-09-22 DIAGNOSIS — R42 Dizziness and giddiness: Secondary | ICD-10-CM | POA: Diagnosis not present

## 2021-09-22 DIAGNOSIS — R29898 Other symptoms and signs involving the musculoskeletal system: Secondary | ICD-10-CM

## 2021-09-22 MED ORDER — CICLOPIROX 8 % EX SOLN: Freq: Every day | CUTANEOUS | 0 refills | Status: DC

## 2021-09-22 MED ORDER — MECLIZINE HCL 12.5 MG PO TABS: 12.5000 mg | ORAL_TABLET | Freq: Three times a day (TID) | ORAL | 0 refills | Status: AC | PRN

## 2021-09-22 NOTE — Patient Instructions (Signed)
1. Dizziness ? ?- meclizine (ANTIVERT) 12.5 MG tablet; Take 1 tablet (12.5 mg total) by mouth 3 (three) times daily as needed for dizziness.  Dispense: 30 tablet; Refill: 0 ?- Ambulatory referral to Neurology ? ?2. Bilateral arm weakness ? ?- Ambulatory referral to Neurology ? ?3. Toenail fungus ? ?- ciclopirox (PENLAC) 8 % solution; Apply topically at bedtime. Apply over nail and surrounding skin. Apply daily over previous coat. After seven (7) days, may remove with alcohol and continue cycle.  Dispense: 6.6 mL; Refill: 0 ? ?4. Umbilical hernia without obstruction and without gangrene ? ?- Ambulatory referral to General Surgery ? ?Follow up: ? ?Follow up in 6 months or sooner if needed ? ?

## 2021-09-22 NOTE — Progress Notes (Signed)
@Patient  ID: , female    DOB: 1966/02/10, 56 y.o.   MRN: [id number] ? ?Chief Complaint  ?Patient presents with  ? Follow-up  ?  Patient is here today to discuss her dizziness she has been having x 3 weeks. ?Patient also has a lump on the right side of her neck that is tender to the touch and she felt it today. ?  ? ? ?Referring provider: ?[id number], FNP ? ? ?HPI ? ?Patient presents today for concerns about dizziness.  She states that over the past few few weeks she has been having dizzy spells.  She states that her arms go numb at times and she feels weak in her legs at times.  She states that she is concerned that she may be developing MS.  She states that both of her sisters have MS. ? ?Patient would also like to follow-up today on umbilical hernia.  She has discussed this with Massie Maroon Hollis at previous visit.  She was told to lose some weight and she states that she has lost 30 to 40 pounds since that visit.  She states that she continues to have issues with the hernia protruding and pain associated with this.  General surgery for further evaluation. ? ?Patient complains today of toenail fungus.  She has discussed this with previous.  Medication but never picked it up from the pharmacy.  She would like a prescription ordered today. ? ? ?Denies f/c/s, n/v/d, hemoptysis, PND, chest pain or edema. ? ? ? ?Allergies  ?Allergen Reactions  ? Codeine Nausea And Vomiting  ? Septra [Sulfamethoxazole-Trimethoprim] Nausea And Vomiting and Rash  ? ? ?Immunization History  ?Administered Date(s) Administered  ? Hepatitis B, adult 02/24/2018  ? Influenza,inj,Quad PF,6+ Mos 03/22/2017, 02/24/2018  ? Pneumococcal Polysaccharide-23 03/22/2017  ? ? ?Past Medical History:  ?Diagnosis Date  ? Bipolar 1 disorder (HCC)   ? Fibromyalgia   ? ? ?Tobacco History: ?Social History  ? ?Tobacco Use  ?Smoking Status Every Day  ? Packs/day: 0.50  ? Types: Cigarettes  ?Smokeless Tobacco Never  ? ?Ready to quit: Not  Answered ?Counseling given: Not Answered ? ? ?Outpatient Encounter Medications as of 09/22/2021  ?Medication Sig  ? ciclopirox (PENLAC) 8 % solution Apply topically at bedtime. Apply over nail and surrounding skin. Apply daily over previous coat. After seven (7) days, may remove with alcohol and continue cycle.  ? divalproex (DEPAKOTE) 500 MG DR tablet   ? gabapentin (NEURONTIN) 300 MG capsule Take 1 capsule (300 mg total) 3 (three) times daily by mouth.  ? meclizine (ANTIVERT) 12.5 MG tablet Take 1 tablet (12.5 mg total) by mouth 3 (three) times daily as needed for dizziness.  ? paliperidone (INVEGA SUSTENNA) 156 MG/ML SUSY injection   ? traZODone (DESYREL) 100 MG tablet Take 100 mg by mouth at bedtime.  ? trihexyphenidyl (ARTANE) 5 MG tablet Take 5 mg by mouth 2 (two) times daily.  ? cephALEXin (KEFLEX) 500 MG capsule Take 1 capsule (500 mg total) by mouth 2 (two) times daily. (Patient not taking: Reported on 09/22/2021)  ? [DISCONTINUED] docusate sodium (COLACE) 100 MG capsule Take 1 capsule (100 mg total) by mouth daily.  ? [DISCONTINUED] ibuprofen (ADVIL,MOTRIN) 800 MG tablet Take 1 tablet (800 mg total) by mouth every 8 (eight) hours as needed. (Patient not taking: Reported on 09/27/2020)  ? [DISCONTINUED] oxybutynin (DITROPAN XL) 5 MG 24 hr tablet Take 1 tablet (5 mg total) by mouth at bedtime.  ? [DISCONTINUED] phenazopyridine (PYRIDIUM) 200  MG tablet Take 1 tablet (200 mg total) by mouth 3 (three) times daily.  ? [DISCONTINUED] Sofosbuvir-Velpatasvir (EPCLUSA) 400-100 MG TABS Take 1 tablet by mouth daily.  ? [DISCONTINUED] traZODone (DESYREL) 50 MG tablet Take 50 mg by mouth at bedtime. (Patient not taking: Reported on 09/22/2021)  ? ?No facility-administered encounter medications on file as of 09/22/2021.  ? ? ? ?Review of Systems ? ?Review of Systems  ?Constitutional: Negative.   ?HENT: Negative.    ?Cardiovascular: Negative.   ?Gastrointestinal: Negative.   ?Allergic/Immunologic: Negative.   ?Neurological:  Negative.   ?Psychiatric/Behavioral: Negative.     ? ? ? ?Physical Exam ? ?BP 104/68   Pulse 81   Temp 98.1 ?F (36.7 ?C)   Ht 5\' 4"  (1.626 m)   Wt 158 lb 9.6 oz (71.9 kg)   SpO2 98%   BMI 27.22 kg/m?  ? ?Wt Readings from Last 5 Encounters:  ?09/22/21 158 lb 9.6 oz (71.9 kg)  ?09/27/20 187 lb 6.4 oz (85 kg)  ?06/28/20 174 lb 3.2 oz (79 kg)  ?02/24/18 153 lb 12.8 oz (69.8 kg)  ?12/07/17 140 lb (63.5 kg)  ? ? ? ?Physical Exam ?Vitals and nursing note reviewed.  ?Constitutional:   ?   General: She is not in acute distress. ?   Appearance: She is well-developed.  ?Cardiovascular:  ?   Rate and Rhythm: Normal rate and regular rhythm.  ?Pulmonary:  ?   Effort: Pulmonary effort is normal.  ?   Breath sounds: Normal breath sounds.  ?Neurological:  ?   Mental Status: She is alert and oriented to person, place, and time.  ? ? ? ?Lab Results: ? ?CBC ?   ?Component Value Date/Time  ? WBC 6.8 05/15/2017 1128  ? RBC 4.88 05/15/2017 1128  ? HGB 14.4 05/15/2017 1128  ? HCT 42.3 05/15/2017 1128  ? PLT 222 05/15/2017 1128  ? MCV 86.7 05/15/2017 1128  ? MCH 29.5 05/15/2017 1128  ? MCHC 34.0 05/15/2017 1128  ? RDW 11.8 05/15/2017 1128  ? [facility] 2,332 05/15/2017 1128  ? EOSABS 122 05/15/2017 1128  ? BASOSABS 48 05/15/2017 1128  ? ? ?BMET ?   ?Component Value Date/Time  ? NA 140 06/28/2020 1214  ? K 4.5 06/28/2020 1214  ? CL 103 06/28/2020 1214  ? CO2 23 06/28/2020 1214  ? GLUCOSE 94 06/28/2020 1214  ? GLUCOSE 98 02/24/2018 1638  ? BUN 16 06/28/2020 1214  ? CREATININE 0.69 06/28/2020 1214  ? CREATININE 0.63 02/24/2018 1638  ? CALCIUM 9.3 06/28/2020 1214  ? GFRNONAA 98 06/28/2020 1214  ? GFRNONAA 102 02/24/2018 1638  ? GFRAA 113 06/28/2020 1214  ? GFRAA 119 02/24/2018 1638  ? ? ?BNP ?No results found for: BNP ? ?ProBNP ?No results found for: PROBNP ? ?Imaging: ?No results found. ? ? ?Assessment & Plan:  ? ?Dizziness ?- meclizine (ANTIVERT) 12.5 MG tablet; Take 1 tablet (12.5 mg total) by mouth 3 (three) times daily as needed for  dizziness.  Dispense: 30 tablet; Refill: 0 ?- Ambulatory referral to Neurology ? ?2. Bilateral arm weakness ? ?- Ambulatory referral to Neurology ? ?3. Toenail fungus ? ?- ciclopirox (PENLAC) 8 % solution; Apply topically at bedtime. Apply over nail and surrounding skin. Apply daily over previous coat. After seven (7) days, may remove with alcohol and continue cycle.  Dispense: 6.6 mL; Refill: 0 ? ?4. Umbilical hernia without obstruction and without gangrene ? ?- Ambulatory referral to General Surgery ? ?Follow up: ? ?Follow up in 6 months  or sooner if needed ? ? ? ? ?Ivonne Andrewonya S Zai Chmiel, NP ?09/22/2021 ? ?

## 2021-09-22 NOTE — Assessment & Plan Note (Signed)
-   meclizine (ANTIVERT) 12.5 MG tablet; Take 1 tablet (12.5 mg total) by mouth 3 (three) times daily as needed for dizziness.  Dispense: 30 tablet; Refill: 0 ?- Ambulatory referral to Neurology ? ?2. Bilateral arm weakness ? ?- Ambulatory referral to Neurology ? ?3. Toenail fungus ? ?- ciclopirox (PENLAC) 8 % solution; Apply topically at bedtime. Apply over nail and surrounding skin. Apply daily over previous coat. After seven (7) days, may remove with alcohol and continue cycle.  Dispense: 6.6 mL; Refill: 0 ? ?4. Umbilical hernia without obstruction and without gangrene ? ?- Ambulatory referral to General Surgery ? ?Follow up: ? ?Follow up in 6 months or sooner if needed ?

## 2021-10-02 ENCOUNTER — Other Ambulatory Visit: Payer: Self-pay

## 2021-10-02 ENCOUNTER — Encounter: Payer: Self-pay | Admitting: Surgery

## 2021-10-02 ENCOUNTER — Ambulatory Visit (INDEPENDENT_AMBULATORY_CARE_PROVIDER_SITE_OTHER): Payer: Managed Care, Other (non HMO) | Admitting: Surgery

## 2021-10-02 VITALS — BP 115/73 | HR 76 | Temp 98.6°F | Ht 64.0 in | Wt 156.2 lb

## 2021-10-02 DIAGNOSIS — K430 Incisional hernia with obstruction, without gangrene: Secondary | ICD-10-CM | POA: Diagnosis not present

## 2021-10-02 NOTE — H&P (View-Only) (Signed)
?10/02/2021 ? ?Reason for Visit:  Incisional hernia ? ?Requesting Provider:  Angus Seller, NP ? ?History of Present Illness: ?Debbie Johnson is a 56 y.o. female presenting for evaluation of an incisional hernia.  Patient reports that she has noticed this for about a year, perhaps more.  Reports that she has been having pain over the periumbilical and epigastric area and reports that there is more discomfort with activity level.  Reports having some nausea and dizziness but denies any emesis.  Denies any constipation or diarrhea.  She does report having an open cholecystectomy done many years ago in the 1980s or perhaps much longer and the hernia is at the medial portion of the incision.  She has talked about this with her PCP and was recommended to lose weight.  She has lost about 30 pounds in about a year.  Denies any fevers, chills, chest pain, shortness of breath. ? ?Past Medical History: ?Past Medical History:  ?Diagnosis Date  ? Bipolar 1 disorder (HCC)   ? Fibromyalgia   ?  ? ?Past Surgical History: ?Past Surgical History:  ?Procedure Laterality Date  ? BREAST SURGERY    ? CHOLECYSTECTOMY    ? NASAL SEPTUM SURGERY    ? WISDOM TOOTH EXTRACTION    ? ? ?Home Medications: ?Prior to Admission medications   ?Medication Sig Start Date End Date Taking? Authorizing Provider  ?ciclopirox (PENLAC) 8 % solution Apply topically at bedtime. Apply over nail and surrounding skin. Apply daily over previous coat. After seven (7) days, may remove with alcohol and continue cycle. 09/22/21  Yes Ivonne Andrew, NP  ?divalproex (DEPAKOTE) 500 MG DR tablet  02/17/18  Yes [provider]  ?gabapentin (NEURONTIN) 300 MG capsule Take 1 capsule (300 mg total) 3 (three) times daily by mouth. 04/22/17  Yes Massie Maroon, FNP  ?meclizine (ANTIVERT) 12.5 MG tablet Take 1 tablet (12.5 mg total) by mouth 3 (three) times daily as needed for dizziness. 09/22/21  Yes Ivonne Andrew, NP  ?paliperidone (INVEGA SUSTENNA) 156 MG/ML  SUSY injection    Yes [provider]  ?traZODone (DESYREL) 100 MG tablet Take 100 mg by mouth at bedtime. 09/19/21  Yes [provider]  ?trihexyphenidyl (ARTANE) 5 MG tablet Take 5 mg by mouth 2 (two) times daily. 09/19/21  Yes [provider]  ? ? ?Allergies: ?Allergies  ?Allergen Reactions  ? Codeine Nausea And Vomiting  ? Septra [Sulfamethoxazole-Trimethoprim] Nausea And Vomiting and Rash  ? ? ?Social History: ? reports that she has been smoking cigarettes. She has been smoking an average of .5 packs per day. She has never used smokeless tobacco. She reports that she does not drink alcohol and does not use drugs.  ? ?Family History: ?Family History  ?Problem Relation Age of Onset  ? Cancer Mother   ?     lung, breast  ? Hypertension Mother   ? Diabetes Father   ? Hypertension Father   ? Hypertension Brother   ? Hypertension Brother   ? Hypertension Brother   ? ? ?Review of Systems: ?Review of Systems  ?Constitutional:  Negative for chills and fever.  ?HENT:  Negative for hearing loss.   ?Respiratory:  Negative for shortness of breath.   ?Cardiovascular:  Negative for chest pain.  ?Gastrointestinal:  Positive for abdominal pain and nausea. Negative for constipation, diarrhea and vomiting.  ?Genitourinary:  Negative for dysuria.  ?Musculoskeletal:  Negative for myalgias.  ?Skin:  Negative for rash.  ?Neurological:  Negative for dizziness.  ?  Psychiatric/Behavioral:  Negative for depression.   ? ?Physical Exam ?BP 115/73   Pulse 76   Temp 98.6 ?F (37 ?C) (Oral)   Ht 5\' 4"  (1.626 m)   Wt 156 lb 3.2 oz (70.9 kg)   SpO2 92%   BMI 26.81 kg/m?  ?CONSTITUTIONAL: No acute distress, well-nourished ?HEENT:  Normocephalic, atraumatic, extraocular motion intact, poor dentition. ?NECK: Trachea is midline, and there is no jugular venous distension.  ?RESPIRATORY:  Lungs are clear, and breath sounds are equal bilaterally. Normal respiratory effort without pathologic use of accessory  muscles. ?CARDIOVASCULAR: Heart is regular without murmurs, gallops, or rubs. ?GI: The abdomen is soft, nondistended, with some tenderness to palpation in the epigastric area at the medial portion of her cholecystectomy incision.  There is an incarcerated incisional hernia at this location which appears to likely contain fat.  It is tender with deep palpation.  Cannot tell the exact measurement of the hernia defect, although appears to be more than 3 cm.  No overlying skin changes to suggest strangulation.  ?MUSCULOSKELETAL:  Normal muscle strength and tone in all four extremities.  No peripheral edema or cyanosis. ?SKIN: Skin turgor is normal. There are no pathologic skin lesions.  ?NEUROLOGIC:  Motor and sensation is grossly normal.  Cranial nerves are grossly intact. ?PSYCH:  Alert and oriented to person, place and time. Affect is normal. ? ?Laboratory Analysis: ?No recent labs ? ?Imaging: ?No recent or pertinent abdominal imaging ? ?Assessment and Plan: ?This is a 56 y.o. female with an incarcerated incisional hernia. ? ?-Discussed with the patient the findings on her exam today and that the hernia that she has a small incisional hernia given that it is at the medial portion of her previous cholecystectomy incision.  The incision has shifted downwards as she has grown.  On exam, the hernia is incarcerated and I am not able to reduce it due to some discomfort.  I discussed with the patient that given the findings, I will be recommended to proceed with hernia repair.  She is in agreement. ?- Discussed with patient the role for a robotic assisted incisional hernia repair and reviewed the surgery at length with her including the risks of bleeding, infection, injury to surrounding structures, the use of mesh to reinforce the repair, postoperative activity restrictions, that this would be an outpatient procedure, pain control, and she is willing to proceed. ?- We will schedule patient for surgery on 10/10/2021.  All  questions have been answered. ? ?I spent 80 minutes dedicated to the care of this patient on the date of this encounter to include pre-visit review of records, face-to-face time with the patient discussing diagnosis and management, and any post-visit coordination of care. ? ? ?12/10/2021, MD ?[facility] ? ?  ?

## 2021-10-02 NOTE — Progress Notes (Signed)
?10/02/2021 ? ?Reason for Visit:  Incisional hernia ? ?Requesting Provider:  Angus Seller, NP ? ?History of Present Illness: ?Debbie Johnson is a 56 y.o. female presenting for evaluation of an incisional hernia.  Patient reports that she has noticed this for about a year, perhaps more.  Reports that she has been having pain over the periumbilical and epigastric area and reports that there is more discomfort with activity level.  Reports having some nausea and dizziness but denies any emesis.  Denies any constipation or diarrhea.  She does report having an open cholecystectomy done many years ago in the 1980s or perhaps much longer and the hernia is at the medial portion of the incision.  She has talked about this with her PCP and was recommended to lose weight.  She has lost about 30 pounds in about a year.  Denies any fevers, chills, chest pain, shortness of breath. ? ?Past Medical History: ?Past Medical History:  ?Diagnosis Date  ? Bipolar 1 disorder (HCC)   ? Fibromyalgia   ?  ? ?Past Surgical History: ?Past Surgical History:  ?Procedure Laterality Date  ? BREAST SURGERY    ? CHOLECYSTECTOMY    ? NASAL SEPTUM SURGERY    ? WISDOM TOOTH EXTRACTION    ? ? ?Home Medications: ?Prior to Admission medications   ?Medication Sig Start Date End Date Taking? Authorizing Provider  ?ciclopirox (PENLAC) 8 % solution Apply topically at bedtime. Apply over nail and surrounding skin. Apply daily over previous coat. After seven (7) days, may remove with alcohol and continue cycle. 09/22/21  Yes Ivonne Andrew, NP  ?divalproex (DEPAKOTE) 500 MG DR tablet  02/17/18  Yes [provider]  ?gabapentin (NEURONTIN) 300 MG capsule Take 1 capsule (300 mg total) 3 (three) times daily by mouth. 04/22/17  Yes Massie Maroon, FNP  ?meclizine (ANTIVERT) 12.5 MG tablet Take 1 tablet (12.5 mg total) by mouth 3 (three) times daily as needed for dizziness. 09/22/21  Yes Ivonne Andrew, NP  ?paliperidone (INVEGA SUSTENNA) 156 MG/ML  SUSY injection    Yes [provider]  ?traZODone (DESYREL) 100 MG tablet Take 100 mg by mouth at bedtime. 09/19/21  Yes [provider]  ?trihexyphenidyl (ARTANE) 5 MG tablet Take 5 mg by mouth 2 (two) times daily. 09/19/21  Yes [provider]  ? ? ?Allergies: ?Allergies  ?Allergen Reactions  ? Codeine Nausea And Vomiting  ? Septra [Sulfamethoxazole-Trimethoprim] Nausea And Vomiting and Rash  ? ? ?Social History: ? reports that she has been smoking cigarettes. She has been smoking an average of .5 packs per day. She has never used smokeless tobacco. She reports that she does not drink alcohol and does not use drugs.  ? ?Family History: ?Family History  ?Problem Relation Age of Onset  ? Cancer Mother   ?     lung, breast  ? Hypertension Mother   ? Diabetes Father   ? Hypertension Father   ? Hypertension Brother   ? Hypertension Brother   ? Hypertension Brother   ? ? ?Review of Systems: ?Review of Systems  ?Constitutional:  Negative for chills and fever.  ?HENT:  Negative for hearing loss.   ?Respiratory:  Negative for shortness of breath.   ?Cardiovascular:  Negative for chest pain.  ?Gastrointestinal:  Positive for abdominal pain and nausea. Negative for constipation, diarrhea and vomiting.  ?Genitourinary:  Negative for dysuria.  ?Musculoskeletal:  Negative for myalgias.  ?Skin:  Negative for rash.  ?Neurological:  Negative for dizziness.  ?  Psychiatric/Behavioral:  Negative for depression.   ? ?Physical Exam ?BP 115/73   Pulse 76   Temp 98.6 ?F (37 ?C) (Oral)   Ht 5\' 4"  (1.626 m)   Wt 156 lb 3.2 oz (70.9 kg)   SpO2 92%   BMI 26.81 kg/m?  ?CONSTITUTIONAL: No acute distress, well-nourished ?HEENT:  Normocephalic, atraumatic, extraocular motion intact, poor dentition. ?NECK: Trachea is midline, and there is no jugular venous distension.  ?RESPIRATORY:  Lungs are clear, and breath sounds are equal bilaterally. Normal respiratory effort without pathologic use of accessory  muscles. ?CARDIOVASCULAR: Heart is regular without murmurs, gallops, or rubs. ?GI: The abdomen is soft, nondistended, with some tenderness to palpation in the epigastric area at the medial portion of her cholecystectomy incision.  There is an incarcerated incisional hernia at this location which appears to likely contain fat.  It is tender with deep palpation.  Cannot tell the exact measurement of the hernia defect, although appears to be more than 3 cm.  No overlying skin changes to suggest strangulation.  ?MUSCULOSKELETAL:  Normal muscle strength and tone in all four extremities.  No peripheral edema or cyanosis. ?SKIN: Skin turgor is normal. There are no pathologic skin lesions.  ?NEUROLOGIC:  Motor and sensation is grossly normal.  Cranial nerves are grossly intact. ?PSYCH:  Alert and oriented to person, place and time. Affect is normal. ? ?Laboratory Analysis: ?No recent labs ? ?Imaging: ?No recent or pertinent abdominal imaging ? ?Assessment and Plan: ?This is a 56 y.o. female with an incarcerated incisional hernia. ? ?-Discussed with the patient the findings on her exam today and that the hernia that she has a small incisional hernia given that it is at the medial portion of her previous cholecystectomy incision.  The incision has shifted downwards as she has grown.  On exam, the hernia is incarcerated and I am not able to reduce it due to some discomfort.  I discussed with the patient that given the findings, I will be recommended to proceed with hernia repair.  She is in agreement. ?- Discussed with patient the role for a robotic assisted incisional hernia repair and reviewed the surgery at length with her including the risks of bleeding, infection, injury to surrounding structures, the use of mesh to reinforce the repair, postoperative activity restrictions, that this would be an outpatient procedure, pain control, and she is willing to proceed. ?- We will schedule patient for surgery on 10/10/2021.  All  questions have been answered. ? ?I spent 80 minutes dedicated to the care of this patient on the date of this encounter to include pre-visit review of records, face-to-face time with the patient discussing diagnosis and management, and any post-visit coordination of care. ? ? ?12/10/2021, MD ?[facility] ? ?  ?

## 2021-10-02 NOTE — Patient Instructions (Signed)
Our surgery scheduler will call you within 24-48 hours to schedule your surgery. Please have the Blue surgery sheet available when speaking with her.   Umbilical Hernia, Adult  A hernia is a bulge of tissue that pushes through an opening between muscles. An umbilical hernia happens in the abdomen, near the belly button (umbilicus). The hernia may contain tissues from the small intestine, large intestine, or fatty tissue covering the intestines. Umbilical hernias in adults tend to get worse over time, and they require surgical treatment. There are different types of umbilical hernias, including: Indirect hernia. This type is located just above or below the umbilicus. It is the most common type of umbilical hernia in adults. Direct hernia. This type forms through an opening formed by the umbilicus. Reducible hernia. This type of hernia comes and goes. It may be visible only when you strain, lift something heavy, or cough. This type of hernia can be pushed back into the abdomen (reduced). Incarcerated hernia. This type traps abdominal tissue inside the hernia. This type of hernia cannot be reduced. Strangulated hernia. This type of hernia cuts off blood flow to the tissues inside the hernia. The tissues can start to die if this happens. This type of hernia requires emergency treatment. What are the causes? An umbilical hernia happens when tissue inside the abdomen presses on a weak area of the abdominal muscles. What increases the risk? You may have a greater risk of this condition if you: Are obese. Have had several pregnancies. Have a buildup of fluid inside your abdomen. Have had surgery that weakens the abdominal muscles. What are the signs or symptoms? The main symptom of this condition is a painless bulge at or near the belly button. A reducible hernia may be visible only when you strain, lift something heavy, or cough. Other symptoms may include: Dull pain. A feeling of pressure. Symptoms  of a strangulated hernia may include: Pain that gets increasingly worse. Nausea and vomiting. Pain when pressing on the hernia. Skin over the hernia becoming red or purple. Constipation. Blood in the stool. How is this diagnosed? This condition may be diagnosed based on: A physical exam. You may be asked to cough or strain while standing. These actions increase the pressure inside your abdomen and can force the hernia through the opening in your muscles. Your health care provider may try to reduce the hernia by pressing on it. Your symptoms and medical history. How is this treated? Surgery is the only treatment for an umbilical hernia. Surgery for a strangulated hernia is done as soon as possible. If you have a small hernia that is not incarcerated, you may need to lose weight before having surgery. Follow these instructions at home: Lose weight, if told by your health care provider. Do not try to push the hernia back in. Watch your hernia for any changes in color or size. Tell your health care provider if any changes occur. You may need to avoid activities that increase pressure on your hernia. Do not lift anything that is heavier than 10 lb (4.5 kg), or the limit that you are told, until your health care provider says that it is safe. Take over-the-counter and prescription medicines only as told by your health care provider. Keep all follow-up visits. This is important. Contact a health care provider if: Your hernia gets larger. Your hernia becomes painful. Get help right away if: You develop sudden, severe pain near the area of your hernia. You have pain as well as nausea   or vomiting. You have pain and the skin over your hernia changes color. You develop a fever or chills. Summary A hernia is a bulge of tissue that pushes through an opening between muscles. An umbilical hernia happens near the belly button. Surgery is the only treatment for an umbilical hernia. Do not try to push  your hernia back in. Keep all follow-up visits. This is important. This information is not intended to replace advice given to you by your health care provider. Make sure you discuss any questions you have with your health care provider. Document Revised: 12/28/2019 Document Reviewed: 12/28/2019 Elsevier Patient Education  2023 [employer]  

## 2021-10-03 ENCOUNTER — Telehealth: Payer: Self-pay | Admitting: Surgery

## 2021-10-03 NOTE — Telephone Encounter (Signed)
Outgoing call is made, left message to call.  Please inform patient of the following regarding scheduled surgery:  ? ?Pre-Admission date/time, COVID Testing date and Surgery date. ? ?Surgery Date: 10/10/21 ?Preadmission Testing Date: 10/06/21 (phone 8a-1p) ?Covid Testing Date: Not needed.    ? ?Also patient will need to call at [phone], between 1-3:00pm the day before surgery, to find out what time to arrive for surgery.   ? ?

## 2021-10-03 NOTE — Telephone Encounter (Signed)
Incoming call from patient, she is now aware of all dates regarding her surgery and verbalized understanding.  

## 2021-10-06 ENCOUNTER — Encounter
Admission: RE | Admit: 2021-10-06 | Discharge: 2021-10-06 | Disposition: A | Discharge status: Home / Self Care | Payer: Commercial Managed Care - HMO | Source: Ambulatory Visit | Attending: Surgery | Admitting: Surgery

## 2021-10-06 VITALS — Ht 64.0 in | Wt 158.0 lb

## 2021-10-06 DIAGNOSIS — Z01812 Encounter for preprocedural laboratory examination: Secondary | ICD-10-CM

## 2021-10-06 NOTE — Patient Instructions (Addendum)
?Your procedure is scheduled on: Tuesday Oct 10, 2021. ?Report to Day Surgery inside [facility] 2nd floor, stop by admissions desk before getting on elevator.  ?To find out your arrival time please call [phone] between 1PM - 3PM on Monday Oct 09, 2021. ? ?Remember: Instructions that are not followed completely may result in serious medical risk,  ?up to and including death, or upon the discretion of your surgeon and anesthesiologist your  ?surgery may need to be rescheduled.  ? ?  _X__ 1. Do not eat food after midnight the night before your procedure. ?                No chewing gum or hard candies. You may drink clear liquids up to 2 hours ?                before you are scheduled to arrive for your surgery- DO not drink clear ?                liquids within 2 hours of the start of your surgery. ?                Clear Liquids include:  water, apple juice without pulp, clear Gatorade, G2 or  ?                Gatorade Zero (avoid Red/Purple/Blue), Black Coffee or Tea (Do not add ?                anything to coffee or tea). ? ?__X__2.  On the morning of surgery brush your teeth with toothpaste and water, you ?               may rinse your mouth with mouthwash if you wish.  Do not swallow any toothpaste or mouthwash. ?   ? _X__ 3.  No Alcohol for 24 hours before or after surgery. ? ? _X__ 4.  Do Not Smoke or use e-cigarettes For 24 Hours Prior to Your Surgery. ?                Do not use any chewable tobacco products for at least 6 hours prior to ?                Surgery. ? ?_X__  5.  Do not use any recreational drugs (marijuana, cocaine, heroin, ecstasy, MDMA or other) ?               For at least one week prior to your surgery.  Combination of these drugs with anesthesia ?               May have life threatening results. ? ?____  6.  Bring all medications with you on the day of surgery if instructed.  ? ?__X__  7.  Notify your doctor if there is any change in your medical condition  ?     (cold, fever, infections). ?    ?Do not wear jewelry, make-up, hairpins, clips or nail polish. ?Do not wear lotions, powders, or perfumes. You may wear deodorant. ?Do not shave 48 hours prior to surgery.  ?Do not bring valuables to the hospital.   ? ?[facility] is not responsible for any belongings or valuables. ? ?Contacts, dentures or bridgework may not be worn into surgery. ?Leave your suitcase in the car. After surgery it may be brought to your room. ?For patients admitted to the hospital, discharge time is determined by your ?treatment team. ?  ?  Patients discharged the day of surgery will not be allowed to drive home.   ?Make arrangements for someone to be with you for the first 24 hours of your ?Same Day Discharge. ? ? ?__X__ Take these medicines the morning of surgery with A SIP OF WATER:  ? ? 1. trihexyphenidyl (ARTANE 5 mg  ? 2. gabapentin (NEURONTIN) 300 MG ? 3. divalproex (DEPAKOTE) 500 MG ? 4. ? 5. ? 6. ? ?____ Fleet Enema (as directed)  ? ?__X__ Use antibacterial Soap (or wipes) as directed ? ?____ Use Benzoyl Peroxide Gel as instructed ? ?____ Use inhalers on the day of surgery ? ?____ Stop metformin 2 days prior to surgery   ? ?____ Take 1/2 of usual insulin dose the night before surgery. No insulin the morning ?         of surgery.  ? ?____ Call your PCP, cardiologist, or Pulmonologist if taking Coumadin/Plavix/aspirin and ask when to stop before your surgery.  ? ?__X__ One Week prior to surgery- Stop Anti-inflammatories such as Ibuprofen, Aleve, Advil, Motrin, meloxicam (MOBIC), diclofenac, etodolac, ketorolac, Toradol, Daypro, piroxicam, Goody's or BC powders. OK TO USE TYLENOL IF NEEDED ?  ?__X__ Stop supplements until after surgery.   ? ?____ Bring C-Pap to the hospital.  ? ? ?If you have any questions regarding your pre-procedure instructions,  ?Please call Pre-admit Testing at [phone] ?

## 2021-10-09 ENCOUNTER — Encounter: Payer: Self-pay | Admitting: Anesthesiology

## 2021-10-10 ENCOUNTER — Ambulatory Visit
Admission: RE | Admit: 2021-10-10 | Discharge: 2021-10-10 | Disposition: A | Discharge status: Home / Self Care | Payer: Commercial Managed Care - HMO | Attending: Surgery | Admitting: Surgery

## 2021-10-10 ENCOUNTER — Encounter
Admission: RE | Disposition: A | Discharge status: Home / Self Care | Payer: Self-pay | Source: Home / Self Care | Attending: Surgery

## 2021-10-10 ENCOUNTER — Telehealth: Payer: Self-pay | Admitting: Surgery

## 2021-10-10 DIAGNOSIS — K430 Incisional hernia with obstruction, without gangrene: Secondary | ICD-10-CM | POA: Insufficient documentation

## 2021-10-10 DIAGNOSIS — Z01812 Encounter for preprocedural laboratory examination: Secondary | ICD-10-CM

## 2021-10-10 DIAGNOSIS — Z5309 Procedure and treatment not carried out because of other contraindication: Secondary | ICD-10-CM | POA: Insufficient documentation

## 2021-10-10 DIAGNOSIS — Z9049 Acquired absence of other specified parts of digestive tract: Secondary | ICD-10-CM | POA: Insufficient documentation

## 2021-10-10 LAB — URINE DRUG SCREEN, QUALITATIVE (ARMC ONLY)
Amphetamines, Ur Screen: POSITIVE — AB
Barbiturates, Ur Screen: NOT DETECTED
Benzodiazepine, Ur Scrn: POSITIVE — AB
Cannabinoid 50 Ng, Ur ~~LOC~~: POSITIVE — AB
Cocaine Metabolite,Ur ~~LOC~~: POSITIVE — AB
MDMA (Ecstasy)Ur Screen: NOT DETECTED
Methadone Scn, Ur: NOT DETECTED
Opiate, Ur Screen: NOT DETECTED
Phencyclidine (PCP) Ur S: NOT DETECTED
Tricyclic, Ur Screen: NOT DETECTED

## 2021-10-10 SURGERY — REPAIR, HERNIA, VENTRAL, ROBOT-ASSISTED
Anesthesia: General

## 2021-10-10 MED ORDER — FAMOTIDINE 20 MG PO TABS
20.0000 mg | ORAL_TABLET | Freq: Once | ORAL | Status: DC
Start: 2021-10-10 — End: 2021-10-12

## 2021-10-10 MED ORDER — CHLORHEXIDINE GLUCONATE 0.12 % MT SOLN: 15.0000 mL | Freq: Once | OROMUCOSAL | Status: DC

## 2021-10-10 MED ORDER — BUPIVACAINE LIPOSOME 1.3 % IJ SUSP: 20.0000 mL | Freq: Once | INTRAMUSCULAR | Status: DC

## 2021-10-10 MED ORDER — LACTATED RINGERS IV SOLN: INTRAVENOUS | Status: DC

## 2021-10-10 MED ORDER — CHLORHEXIDINE GLUCONATE CLOTH 2 % EX PADS: 6.0000 | MEDICATED_PAD | Freq: Once | CUTANEOUS | Status: DC

## 2021-10-10 MED ORDER — ORAL CARE MOUTH RINSE: 15.0000 mL | Freq: Once | OROMUCOSAL | Status: DC

## 2021-10-10 MED ORDER — CEFAZOLIN SODIUM-DEXTROSE 2-4 GM/100ML-% IV SOLN: 2.0000 g | INTRAVENOUS | Status: DC

## 2021-10-10 MED ORDER — ACETAMINOPHEN 500 MG PO TABS: 1000.0000 mg | ORAL_TABLET | ORAL | Status: DC

## 2021-10-10 MED ORDER — GABAPENTIN 300 MG PO CAPS: 300.0000 mg | ORAL_CAPSULE | ORAL | Status: DC

## 2021-10-10 SURGICAL SUPPLY — 55 items
BLADE SURG SZ11 CARB STEEL (BLADE) ×2 IMPLANT
CANNULA REDUC XI 12-8 STAPL (CANNULA) ×1
CANNULA REDUCER 12-8 DVNC XI (CANNULA) ×1 IMPLANT
COVER TIP SHEARS 8 DVNC (MISCELLANEOUS) ×1 IMPLANT
COVER TIP SHEARS 8MM DA VINCI (MISCELLANEOUS) ×1
COVER WAND RF STERILE (DRAPES) ×2 IMPLANT
DERMABOND ADVANCED (GAUZE/BANDAGES/DRESSINGS) ×1
DERMABOND ADVANCED .7 DNX12 (GAUZE/BANDAGES/DRESSINGS) ×1 IMPLANT
DRAPE ARM DVNC X/XI (DISPOSABLE) ×3 IMPLANT
DRAPE COLUMN DVNC XI (DISPOSABLE) ×1 IMPLANT
DRAPE DA VINCI XI ARM (DISPOSABLE) ×3
DRAPE DA VINCI XI COLUMN (DISPOSABLE) ×1
ELECT CAUTERY BLADE TIP 2.5 (TIP) ×2
ELECT REM PT RETURN 9FT ADLT (ELECTROSURGICAL) ×2
ELECTRODE CAUTERY BLDE TIP 2.5 (TIP) ×1 IMPLANT
ELECTRODE REM PT RTRN 9FT ADLT (ELECTROSURGICAL) ×1 IMPLANT
GLOVE SURG SYN 7.0 (GLOVE) ×4 IMPLANT
GLOVE SURG SYN 7.5  E (GLOVE) ×2
GLOVE SURG SYN 7.5 E (GLOVE) ×2 IMPLANT
GOWN STRL REUS W/ TWL LRG LVL3 (GOWN DISPOSABLE) ×3 IMPLANT
GOWN STRL REUS W/TWL LRG LVL3 (GOWN DISPOSABLE) ×3
GRASPER SUT TROCAR 14GX15 (MISCELLANEOUS) ×2 IMPLANT
IRRIGATION STRYKERFLOW (MISCELLANEOUS) IMPLANT
IRRIGATOR STRYKERFLOW (MISCELLANEOUS)
IV NS 1000ML (IV SOLUTION)
IV NS 1000ML BAXH (IV SOLUTION) IMPLANT
KIT PINK PAD W/HEAD ARE REST (MISCELLANEOUS) ×2
KIT PINK PAD W/HEAD ARM REST (MISCELLANEOUS) ×1 IMPLANT
LABEL OR SOLS (LABEL) ×2 IMPLANT
MANIFOLD NEPTUNE II (INSTRUMENTS) ×2 IMPLANT
MESH VENT LT ST 15CM CRL ECHO2 (Mesh General) IMPLANT
MESH VENTRALIGHT ST 4.5 ECHO (Mesh General) IMPLANT
NEEDLE HYPO 22GX1.5 SAFETY (NEEDLE) ×2 IMPLANT
NEEDLE INSUFFLATION 14GA 120MM (NEEDLE) ×2 IMPLANT
OBTURATOR OPTICAL STANDARD 8MM (TROCAR) ×1
OBTURATOR OPTICAL STND 8 DVNC (TROCAR) ×1
OBTURATOR OPTICALSTD 8 DVNC (TROCAR) ×1 IMPLANT
PACK LAP CHOLECYSTECTOMY (MISCELLANEOUS) ×2 IMPLANT
PENCIL ELECTRO HAND CTR (MISCELLANEOUS) ×2 IMPLANT
SEAL CANN UNIV 5-8 DVNC XI (MISCELLANEOUS) ×2 IMPLANT
SEAL XI 5MM-8MM UNIVERSAL (MISCELLANEOUS) ×2
SET TUBE SMOKE EVAC HIGH FLOW (TUBING) ×2 IMPLANT
SOLUTION ELECTROLUBE (MISCELLANEOUS) ×2 IMPLANT
SPONGE T-LAP 18X18 ~~LOC~~+RFID (SPONGE) ×2 IMPLANT
STAPLER CANNULA SEAL DVNC XI (STAPLE) ×1 IMPLANT
STAPLER CANNULA SEAL XI (STAPLE) ×1
SUT MNCRL 4-0 (SUTURE) ×1
SUT MNCRL 4-0 27XMFL (SUTURE) ×1
SUT STRATAFIX PDS 30 CT-1 (SUTURE) ×2 IMPLANT
SUT VICRYL 0 AB UR-6 (SUTURE) ×4 IMPLANT
SUT VLOC 90 2/L VL 12 GS22 (SUTURE) ×4 IMPLANT
SUTURE MNCRL 4-0 27XMF (SUTURE) ×1 IMPLANT
TAPE TRANSPORE STRL 2 31045 (GAUZE/BANDAGES/DRESSINGS) ×2 IMPLANT
TRAY FOLEY SLVR 16FR LF STAT (SET/KITS/TRAYS/PACK) ×2 IMPLANT
WATER STERILE IRR 500ML POUR (IV SOLUTION) ×2 IMPLANT

## 2021-10-10 NOTE — Interval H&P Note (Signed)
History and Physical Interval Note: ? ?10/10/2021 ?7:11 AM ? ?[facility]  has presented today for surgery, with the diagnosis of incarcerated incisional hernia 3-10 cm.  However, the patient reports recent drug use of cocaine and marijuana.  Urine drug screen confirms this and is positive for cocaine, amphetamine, marijuana, and benzodiazepine.  Discussed that these drugs would potentially compromise her health if we were to proceed with surgery and the safest thing to do is to cancel surgery today.  Recommended that she abstain from doing drugs.  Will reschedule her for next week.  ? ? ?Marua Qin ? ? ?

## 2021-10-10 NOTE — Telephone Encounter (Signed)
Updated information regarding rescheduled surgery.  Left message for patient to call, please inform her of the following regarding scheduled surgery:  ? ?Pre-Admission date/time, COVID Testing date and Surgery date. ? ?Surgery Date: 10/18/21 ?Preadmission Testing Date: 10/13/21 (phone 8a-1p) ?Covid Testing Date: Not needed.    ? ?Also patient will need to call at [phone], between 1-3:00pm the day before surgery, to find out what time to arrive for surgery.   ? ?

## 2021-10-11 NOTE — Telephone Encounter (Signed)
Tried calling patient again, was not able to leave a message.  Trying to call her to provide updated information regarding rescheduled surgery.  ?

## 2021-10-12 ENCOUNTER — Ambulatory Visit: Payer: Self-pay | Admitting: Neurology

## 2021-10-13 ENCOUNTER — Encounter
Admit: 2021-10-13 | Discharge: 2021-10-13 | Disposition: A | Discharge status: Home / Self Care | Payer: 59 | Attending: Surgery | Admitting: Surgery

## 2021-10-13 DIAGNOSIS — Z01812 Encounter for preprocedural laboratory examination: Secondary | ICD-10-CM

## 2021-10-13 NOTE — Telephone Encounter (Signed)
Patient aware of her rescheduled surgery.  ?

## 2021-10-17 ENCOUNTER — Telehealth: Payer: Self-pay | Admitting: Surgery

## 2021-10-17 ENCOUNTER — Encounter: Payer: Self-pay | Admitting: Neurology

## 2021-10-17 ENCOUNTER — Ambulatory Visit: Payer: Self-pay | Admitting: Neurology

## 2021-10-17 NOTE — Telephone Encounter (Signed)
Patient states that she called the OR to cancel her scheduled surgery for 10/18/21.  She is scheduled to see a neurologist for dizziness and bilateral arm pain.  States she has been waiting for this appointment for months.  She wants to come back in mid June for follow up with  Dr. Hampton Abbot and will discuss rescheduling of her surgery for incisional hernia then.   ? ?

## 2021-10-18 ENCOUNTER — Ambulatory Visit: Admission: RE | Admit: 2021-10-18 | Payer: Commercial Managed Care - HMO | Source: Home / Self Care | Admitting: Surgery

## 2021-10-18 ENCOUNTER — Encounter: Admission: RE | Payer: Self-pay | Source: Home / Self Care

## 2021-10-18 SURGERY — REPAIR, HERNIA, VENTRAL, ROBOT-ASSISTED
Anesthesia: General

## 2021-11-13 ENCOUNTER — Ambulatory Visit: Payer: 59 | Admitting: Surgery

## 2022-03-27 ENCOUNTER — Ambulatory Visit: Payer: Self-pay | Admitting: Family Medicine

## 2022-03-28 ENCOUNTER — Ambulatory Visit: Payer: 59 | Admitting: Nurse Practitioner

## 2022-06-21 DIAGNOSIS — Z1231 Encounter for screening mammogram for malignant neoplasm of breast: Secondary | ICD-10-CM

## 2022-09-14 DIAGNOSIS — F319 Bipolar disorder, unspecified: Secondary | ICD-10-CM | POA: Diagnosis not present

## 2022-09-14 DIAGNOSIS — Z1231 Encounter for screening mammogram for malignant neoplasm of breast: Secondary | ICD-10-CM | POA: Diagnosis not present

## 2022-09-14 DIAGNOSIS — Z8619 Personal history of other infectious and parasitic diseases: Secondary | ICD-10-CM | POA: Diagnosis not present

## 2022-09-14 DIAGNOSIS — Z0000 Encounter for general adult medical examination without abnormal findings: Secondary | ICD-10-CM | POA: Diagnosis not present

## 2022-09-20 ENCOUNTER — Encounter: Payer: Self-pay | Admitting: Gastroenterology

## 2022-09-27 ENCOUNTER — Telehealth: Payer: Self-pay

## 2022-09-27 NOTE — Telephone Encounter (Signed)
Unable to reach pt for PV. Left VM for pt to call back by end of day to have her apt r/s to avoid cancellation

## 2022-10-15 ENCOUNTER — Encounter: Payer: 59 | Admitting: Gastroenterology

## 2022-12-17 ENCOUNTER — Inpatient Hospital Stay: Admission: RE | Admit: 2022-12-17 | Payer: 59 | Source: Ambulatory Visit

## 2023-02-14 ENCOUNTER — Telehealth: Payer: Self-pay

## 2023-02-14 NOTE — Telephone Encounter (Signed)
Unable to reach patient for PV. Multiple attempts made. PV to be r/s by 5 PM today to avoid cancellation of colonoscopy with Dr. Adela Lank. VM left making patient aware. No show letter to be sent at end of day of patient does not call to r/s.

## 2023-02-26 ENCOUNTER — Encounter: Payer: 59 | Admitting: Gastroenterology

## 2023-02-27 ENCOUNTER — Ambulatory Visit (AMBULATORY_SURGERY_CENTER): Payer: 59

## 2023-02-27 ENCOUNTER — Other Ambulatory Visit: Payer: Self-pay

## 2023-02-27 VITALS — Ht 64.0 in | Wt 175.0 lb

## 2023-02-27 DIAGNOSIS — Z1211 Encounter for screening for malignant neoplasm of colon: Secondary | ICD-10-CM

## 2023-02-27 MED ORDER — NA SULFATE-K SULFATE-MG SULF 17.5-3.13-1.6 GM/177ML PO SOLN: 1.0000 | Freq: Once | ORAL | 0 refills | Status: AC

## 2023-02-27 NOTE — Progress Notes (Signed)
Denies allergies to eggs or soy products. Denies complication of anesthesia or sedation. Denies use of weight loss medication. Denies use of O2.   Emmi instructions given for colonoscopy. 

## 2023-03-15 ENCOUNTER — Telehealth: Payer: Self-pay | Admitting: Gastroenterology

## 2023-03-15 ENCOUNTER — Other Ambulatory Visit: Payer: Self-pay

## 2023-03-15 DIAGNOSIS — Z1211 Encounter for screening for malignant neoplasm of colon: Secondary | ICD-10-CM

## 2023-03-15 MED ORDER — PEG 3350-KCL-NA BICARB-NACL 420 G PO SOLR: 4000.0000 mL | Freq: Once | ORAL | 0 refills | Status: AC

## 2023-03-15 NOTE — Telephone Encounter (Signed)
Inbound call from patient, would like generic form of Suprep sent to [facility] states the pharmacy she currently has is too expensive.

## 2023-03-15 NOTE — Telephone Encounter (Signed)
Rx for Golytely sent to [facility] as requested. New prep instructions reviewed and provided for patient via email all pt identifiers removed sent securely.

## 2023-03-19 ENCOUNTER — Encounter: Payer: 59 | Admitting: Gastroenterology

## 2023-03-19 ENCOUNTER — Telehealth: Payer: Self-pay | Admitting: Gastroenterology

## 2023-03-19 MED ORDER — ONDANSETRON HCL 4 MG PO TABS: 4.0000 mg | ORAL_TABLET | ORAL | 0 refills | Status: DC | PRN

## 2023-03-19 NOTE — Addendum Note (Signed)
Addended by: Corbin Ade L on: 03/19/2023 12:10 PM   Modules accepted: Orders

## 2023-03-19 NOTE — Telephone Encounter (Signed)
Patient called states she is having trouble with her prep and her procedure is scheduled for this afternoon. Please advise.

## 2023-03-19 NOTE — Telephone Encounter (Signed)
Pt unable to tolerate Golytely bowel prep. Having vomiting and has only had 3 bowel movements since starting the prep last night. Pt unable to get to a pharmacy today to pick up a different prep and nausea medication per MD recommendation. Pt states she does not have a ride and cannot afford any other prep at this time, but if she has a "heads up she can make arrangements for assistance." Pt rescheduled for 04/05/23 at 11:30AM. New instructions for Miralax prep sent to pt via MyChart and prescription for Zofran sent to pt's pharmacy. Reviewed supplies needed for the prep and the care partner policy with pt. She verbalized understanding.

## 2023-03-19 NOTE — Telephone Encounter (Signed)
error 

## 2023-04-01 NOTE — Telephone Encounter (Signed)
Called pt and helped her locate her instructions that were sent via MyChart. Pt able to locate instructions. Reminded pt to take Zofran 4mg  30 minutes-1 hour before each dose of prep. Pt verbalized understanding.

## 2023-04-01 NOTE — Telephone Encounter (Signed)
Patient called is requesting new prep instructions.

## 2023-04-04 NOTE — Telephone Encounter (Signed)
Patient case manager called and stated that the patient needed to cancel appointment for personal reasons.

## 2023-04-05 ENCOUNTER — Encounter: Payer: 59 | Admitting: Gastroenterology

## 2023-04-05 NOTE — Telephone Encounter (Signed)
This is the second time she has cancelled this procedure. Unfortunately due to late notice she will have to be charged a late cancellation fee.

## 2023-04-08 ENCOUNTER — Ambulatory Visit: Payer: 59 | Admitting: Surgery

## 2023-07-01 ENCOUNTER — Encounter: Payer: Self-pay | Admitting: Neurology

## 2023-07-16 ENCOUNTER — Telehealth: Payer: Self-pay | Admitting: Neurology

## 2023-07-16 ENCOUNTER — Ambulatory Visit: Payer: 59 | Admitting: Neurology

## 2023-07-16 NOTE — Telephone Encounter (Signed)
Pt reschedule appointment due to daughter has passed away.

## 2023-10-29 ENCOUNTER — Ambulatory Visit: Payer: MEDICAID | Admitting: Neurology

## 2023-10-29 ENCOUNTER — Encounter: Payer: Self-pay | Admitting: Neurology

## 2023-10-29 VITALS — BP 112/73 | HR 82 | Ht 64.0 in | Wt 189.5 lb

## 2023-10-29 DIAGNOSIS — R410 Disorientation, unspecified: Secondary | ICD-10-CM

## 2023-10-29 DIAGNOSIS — R42 Dizziness and giddiness: Secondary | ICD-10-CM | POA: Diagnosis not present

## 2023-10-29 MED ORDER — ALPRAZOLAM 1 MG PO TABS: ORAL_TABLET | ORAL | 0 refills | Status: AC

## 2023-10-29 NOTE — Progress Notes (Unsigned)
 Chief Complaint  Patient presents with   New Patient (Initial Visit)    Rm13, friend present, NP Internal referral for dizziness: intermittent, worse at times and is accompanied by nausea and dizziness. bilateral arm weakness: ongoing a yr  orthostatic bp completed   ASSESSMENT AND PLAN  [facility] is a 58 y.o. female   Intermittent tinnitus, headache, confusion Schizophrenia, on polypharmacy including monthly injection of invega, Depakote DR 500 mg twice daily Polysubstance abuse  Complete evaluation with MRI of the brain,  Laboratory evaluations  EEG  Follow-up depend on above workup result DIAGNOSTIC DATA (LABS, IMAGING, TESTING) - I reviewed patient records, labs, notes, testing and imaging myself where available.   MEDICAL HISTORY:  [facility] is a 58 year old female seen in request by her primary care nurse practitioner Nichols, Tonya for evaluation of dizziness, initial evaluation Oct 29, 2023  History is obtained from the patient and review of electronic medical records. I personally reviewed pertinent available imaging films in PACS.   PMHx of  Chronic insomnia Bipolar  Smoke 1/2 PPD Hx of cocaine use, marijuana Schizophrenia, receiving  invega treatment, also on Depakote 500 mg twice a day Hepatitis C  She is a poor historian, complains of intermittent dizziness," as if something click in my head", often with mild holoacranial headaches,  She also complains of intermittent confusion episode, but is difficult to pin down the detail of her story,   She denies lateralized motor or sensory deficit  She has a history of polysubstance abuse, drug screen May 2023 was positive at the same time for amphetamine, cocaine, marijuana, benzo   PHYSICAL EXAM:   Vitals:   10/29/23 1542 10/29/23 1544 10/29/23 1545  BP: 121/70 122/71 112/73  Pulse: 73 80 82  SpO2: 94% 94% 94%  Weight: 189 lb 8 oz (86 kg)    Height: 5\' 4"  (1.626 m)       Body mass index  is 32.53 kg/m.  PHYSICAL EXAMNIATION:  Gen: NAD, conversant, well nourised, well groomed                     Cardiovascular: Regular rate rhythm, no peripheral edema, warm, nontender. Eyes: Conjunctivae clear without exudates or hemorrhage Neck: Supple, no carotid bruits. Pulmonary: Clear to auscultation bilaterally   NEUROLOGICAL EXAM:  MENTAL STATUS: Speech/cognition: Tired looking elderly middle-age female, awake, alert, oriented to history taking and casual conversation CRANIAL NERVES: CN II: Visual fields are full to confrontation. Pupils are round equal and briskly reactive to light. CN III, IV, VI: extraocular movement are normal. No ptosis. CN V: Facial sensation is intact to light touch CN VII: Face is symmetric with normal eye closure  CN VIII: Hearing is normal to causal conversation. CN IX, X: Phonation is normal. CN XI: Head turning and shoulder shrug are intact  MOTOR: There is no pronator drift of out-stretched arms. Muscle bulk and tone are normal. Muscle strength is normal.  REFLEXES: Reflexes are 2+ and symmetric at the biceps, triceps, knees, and ankles. Plantar responses are flexor.  SENSORY: Intact to light touch, pinprick and vibratory sensation are intact in fingers and toes.  COORDINATION: There is no trunk or limb dysmetria noted.  GAIT/STANCE: Posture is normal. Gait is steady    REVIEW OF SYSTEMS:  Full 14 system review of systems performed and notable only for as above All other review of systems were negative.   ALLERGIES: Allergies  Allergen Reactions   Codeine Nausea And Vomiting  Septra [Sulfamethoxazole-Trimethoprim] Nausea And Vomiting and Rash    HOME MEDICATIONS: Current Outpatient Medications  Medication Sig Dispense Refill   divalproex (DEPAKOTE) 500 MG DR tablet Take 500 mg by mouth 2 (two) times daily.  3   gabapentin  (NEURONTIN ) 300 MG capsule Take 1 capsule (300 mg total) 3 (three) times daily by mouth. 90 capsule 5    ibuprofen  (ADVIL ) 800 MG tablet Take 800 mg by mouth every 8 (eight) hours as needed.     meclizine  (ANTIVERT ) 12.5 MG tablet Take 1 tablet (12.5 mg total) by mouth 3 (three) times daily as needed for dizziness. 30 tablet 0   paliperidone (INVEGA SUSTENNA) 156 MG/ML SUSY injection Inject 156 mg into the muscle every 30 (thirty) days.     traZODone (DESYREL) 100 MG tablet Take 100 mg by mouth at bedtime.     trihexyphenidyl (ARTANE) 5 MG tablet Take 5 mg by mouth 2 (two) times daily.     No current facility-administered medications for this visit.    PAST MEDICAL HISTORY: Past Medical History:  Diagnosis Date   Allergy    Anxiety    Arthritis    Bipolar 1 disorder (HCC)    Depression    Fibromyalgia    GERD (gastroesophageal reflux disease)    Substance abuse (HCC)     PAST SURGICAL HISTORY: Past Surgical History:  Procedure Laterality Date   BREAST SURGERY     CHOLECYSTECTOMY     MOUTH SURGERY Bilateral    NASAL SEPTUM SURGERY     WISDOM TOOTH EXTRACTION      FAMILY HISTORY: Family History  Problem Relation Age of Onset   Esophageal cancer Mother    Cancer Mother        lung, breast   Hypertension Mother    Diabetes Father    Hypertension Father    Hypertension Brother    Hypertension Brother    Hypertension Brother    Colon cancer Neg Hx    Rectal cancer Neg Hx    Stomach cancer Neg Hx     SOCIAL HISTORY: Social History   Socioeconomic History   Marital status: Divorced    Spouse name: Not on file   Number of children: Not on file   Years of education: Not on file   Highest education level: Not on file  Occupational History   Not on file  Tobacco Use   Smoking status: Every Day    Current packs/day: 0.50    Types: Cigarettes   Smokeless tobacco: Never  Vaping Use   Vaping status: Never Used  Substance and Sexual Activity   Alcohol use: No   Drug use: No    Comment: recovering addict   Sexual activity: Not Currently  Other Topics Concern   Not  on file  Social History Narrative   Not on file   Social Drivers of Health   Financial Resource Strain: High Risk (11/05/2022)   Received from [facility], [facility]   Overall Financial Resource Strain (CARDIA)    Difficulty of Paying Living Expenses: Hard  Food Insecurity: Food Insecurity Present (11/05/2022)   Received from [facility], [facility]   Hunger Vital Sign    Worried About Running Out of Food in the Last Year: Often true    Ran Out of Food in the Last Year: Often true  Transportation Needs: Unmet Transportation Needs (11/05/2022)   Received from [employer], [facility]   [facility] - Transportation    Lack of Transportation (Medical):  Yes    Lack of Transportation (Non-Medical): Yes  Physical Activity: Insufficiently Active (11/05/2022)   Received from [facility], [facility]   Exercise Vital Sign    Days of Exercise per Week: 7 days    Minutes of Exercise per Session: 20 min  Stress: Stress Concern Present (11/05/2022)   Received from [facility], [facility] - Occupational Stress Questionnaire    Feeling of Stress : Very much  Social Connections: Moderately Integrated (11/05/2022)   [facility]   Social Network    How would you rate your social network (family, work, friends)?: Adequate participation with social networks  Intimate Partner Violence: Not At Risk (11/05/2022)   Received from [facility], [facility]   HITS    Over the last 12 months how often did your partner physically hurt you?: Never    Over the last 12 months how often did your partner insult you or talk down to you?: Never    Over the last 12 months how often did your partner threaten you with physical harm?: Never    Over the last 12 months how often did your partner scream or curse at you?: Never      Phebe Brasil, M.D. Ph.D.  [facility] [address] [place], [place]  [address] Ph: [phone] Fax: [phone]  CC:  Jerrlyn Morel, NP [phone] [place],  [place] [address]  Nichols, Tonya S, NP

## 2023-10-31 ENCOUNTER — Ambulatory Visit: Payer: Self-pay | Admitting: Neurology

## 2023-10-31 LAB — CBC WITH DIFFERENTIAL/PLATELET
Basophils Absolute: 0.1 10*3/uL (ref 0.0–0.2)
Basos: 1 %
EOS (ABSOLUTE): 0.3 10*3/uL (ref 0.0–0.4)
Eos: 3 %
Hematocrit: 41.8 % (ref 34.0–46.6)
Hemoglobin: 13.6 g/dL (ref 11.1–15.9)
Immature Grans (Abs): 0 10*3/uL (ref 0.0–0.1)
Immature Granulocytes: 0 %
Lymphocytes Absolute: 3.4 10*3/uL — ABNORMAL HIGH (ref 0.7–3.1)
Lymphs: 42 %
MCH: 29.6 pg (ref 26.6–33.0)
MCHC: 32.5 g/dL (ref 31.5–35.7)
MCV: 91 fL (ref 79–97)
Monocytes Absolute: 0.6 10*3/uL (ref 0.1–0.9)
Monocytes: 7 %
Neutrophils Absolute: 3.8 10*3/uL (ref 1.4–7.0)
Neutrophils: 47 %
Platelets: 271 10*3/uL (ref 150–450)
RBC: 4.6 x10E6/uL (ref 3.77–5.28)
RDW: 12.4 % (ref 11.7–15.4)
WBC: 8.1 10*3/uL (ref 3.4–10.8)

## 2023-10-31 LAB — COMPREHENSIVE METABOLIC PANEL WITH GFR
ALT: 16 IU/L (ref 0–32)
AST: 17 IU/L (ref 0–40)
Albumin: 4.3 g/dL (ref 3.8–4.9)
Alkaline Phosphatase: 67 IU/L (ref 44–121)
BUN/Creatinine Ratio: 26 — ABNORMAL HIGH (ref 9–23)
BUN: 22 mg/dL (ref 6–24)
Bilirubin Total: 0.2 mg/dL (ref 0.0–1.2)
CO2: 23 mmol/L (ref 20–29)
Calcium: 8.9 mg/dL (ref 8.7–10.2)
Chloride: 101 mmol/L (ref 96–106)
Creatinine, Ser: 0.86 mg/dL (ref 0.57–1.00)
Globulin, Total: 2.6 g/dL (ref 1.5–4.5)
Glucose: 76 mg/dL (ref 70–99)
Potassium: 4.1 mmol/L (ref 3.5–5.2)
Sodium: 138 mmol/L (ref 134–144)
Total Protein: 6.9 g/dL (ref 6.0–8.5)
eGFR: 78 mL/min/{1.73_m2} (ref >59)

## 2023-10-31 LAB — HIV ANTIBODY (ROUTINE TESTING W REFLEX)

## 2023-10-31 LAB — VITAMIN B12: Vitamin B-12: 542 pg/mL (ref 232–1245)

## 2023-10-31 LAB — RPR: RPR Ser Ql: NONREACTIVE

## 2023-11-06 ENCOUNTER — Telehealth: Payer: Self-pay | Admitting: Neurology

## 2023-11-06 NOTE — Telephone Encounter (Signed)
 trillium Siegfried Dress: [id number] exp. 11/04/2023-01/03/2024 sent to [facility] for an open MRI. [phone]

## 2023-11-07 ENCOUNTER — Telehealth: Payer: Self-pay | Admitting: Neurology

## 2023-11-07 ENCOUNTER — Other Ambulatory Visit: Payer: MEDICAID | Admitting: *Deleted

## 2023-11-07 LAB — DRUG SCREEN 10 W/CONF, SERUM
Amphetamines, IA: NEGATIVE ng/mL
Barbiturates, IA: NEGATIVE ug/mL
Benzodiazepines, IA: NEGATIVE ng/mL
Methadone, IA: NEGATIVE ng/mL
Opiates, IA: NEGATIVE ng/mL
Oxycodones, IA: NEGATIVE ng/mL
Phencyclidine, IA: NEGATIVE ng/mL
Propoxyphene, IA: NEGATIVE ng/mL

## 2023-11-07 LAB — THC,MS,WB/SP RFX
Cannabidiol: NEGATIVE ng/mL
Cannabinoid Confirmation: POSITIVE
Carboxy-THC: 1.8 ng/mL
Hydroxy-THC: NEGATIVE ng/mL
Tetrahydrocannabinol(THC): NEGATIVE ng/mL

## 2023-11-07 LAB — COCAINE,MS,WB/SP RFX
Benzoylecgonine: >1500 ng/mL
Cocaine Confirmation: POSITIVE
Cocaine: NEGATIVE ng/mL

## 2023-11-07 NOTE — Telephone Encounter (Signed)
 Appointment cx, conflict w/ other appointment
